# Patient Record
Sex: Male | Born: 1955 | Race: Black or African American | Hispanic: No | Smoking: Never smoker
Health system: Southern US, Community
[De-identification: ages and names within clinical notes are randomized; demographics above are authoritative.]

## PROBLEM LIST (undated history)

## (undated) DIAGNOSIS — B20 Human immunodeficiency virus [HIV] disease: Secondary | ICD-10-CM

## (undated) DIAGNOSIS — M1990 Unspecified osteoarthritis, unspecified site: Secondary | ICD-10-CM

---

## 1993-10-08 HISTORY — PX: OTHER SURGICAL HISTORY: SHX169

## 1999-05-22 DIAGNOSIS — F419 Anxiety disorder, unspecified: Secondary | ICD-10-CM

## 1999-05-22 HISTORY — DX: Anxiety disorder, unspecified: F41.9

## 2020-05-21 ENCOUNTER — Encounter (HOSPITAL_COMMUNITY): Payer: Self-pay

## 2020-05-21 ENCOUNTER — Emergency Department (HOSPITAL_COMMUNITY): Payer: No Typology Code available for payment source

## 2020-05-21 ENCOUNTER — Observation Stay (HOSPITAL_BASED_OUTPATIENT_CLINIC_OR_DEPARTMENT_OTHER): Payer: No Typology Code available for payment source

## 2020-05-21 ENCOUNTER — Other Ambulatory Visit: Payer: Self-pay

## 2020-05-21 ENCOUNTER — Observation Stay (HOSPITAL_COMMUNITY)
Admission: EM | Admit: 2020-05-21 | Discharge: 2020-05-23 | Disposition: A | Discharge status: Home / Self Care | Payer: No Typology Code available for payment source | Attending: Internal Medicine | Admitting: Internal Medicine

## 2020-05-21 DIAGNOSIS — G459 Transient cerebral ischemic attack, unspecified: Principal | ICD-10-CM

## 2020-05-21 DIAGNOSIS — I10 Essential (primary) hypertension: Secondary | ICD-10-CM | POA: Insufficient documentation

## 2020-05-21 DIAGNOSIS — Z7982 Long term (current) use of aspirin: Secondary | ICD-10-CM | POA: Diagnosis not present

## 2020-05-21 DIAGNOSIS — Z79899 Other long term (current) drug therapy: Secondary | ICD-10-CM | POA: Diagnosis not present

## 2020-05-21 DIAGNOSIS — Z20822 Contact with and (suspected) exposure to covid-19: Secondary | ICD-10-CM | POA: Insufficient documentation

## 2020-05-21 DIAGNOSIS — Z21 Asymptomatic human immunodeficiency virus [HIV] infection status: Secondary | ICD-10-CM | POA: Insufficient documentation

## 2020-05-21 DIAGNOSIS — R202 Paresthesia of skin: Secondary | ICD-10-CM | POA: Diagnosis present

## 2020-05-21 HISTORY — DX: Human immunodeficiency virus (HIV) disease: B20

## 2020-05-21 HISTORY — DX: Unspecified osteoarthritis, unspecified site: M19.90

## 2020-05-21 LAB — CBC
HCT: 39.4 % (ref 39.0–52.0)
Hemoglobin: 13 g/dL (ref 13.0–17.0)
MCH: 30.2 pg (ref 26.0–34.0)
MCHC: 33 g/dL (ref 30.0–36.0)
MCV: 91.6 fL (ref 80.0–100.0)
Platelets: 188 10*3/uL (ref 150–400)
RBC: 4.3 MIL/uL (ref 4.22–5.81)
RDW: 13.3 % (ref 11.5–15.5)
WBC: 3.8 10*3/uL — ABNORMAL LOW (ref 4.0–10.5)
nRBC: 0 % (ref 0.0–0.2)

## 2020-05-21 LAB — ECHOCARDIOGRAM COMPLETE BUBBLE STUDY
Area-P 1/2: 2.48 cm2
S' Lateral: 3.08 cm

## 2020-05-21 LAB — URINALYSIS, COMPLETE (UACMP) WITH MICROSCOPIC
Bacteria, UA: NONE SEEN
Bilirubin Urine: NEGATIVE
Glucose, UA: NEGATIVE mg/dL
Hgb urine dipstick: NEGATIVE
Ketones, ur: NEGATIVE mg/dL
Leukocytes,Ua: NEGATIVE
Nitrite: NEGATIVE
Protein, ur: NEGATIVE mg/dL
Specific Gravity, Urine: 1.003 — ABNORMAL LOW (ref 1.005–1.030)
pH: 7 (ref 5.0–8.0)

## 2020-05-21 LAB — BASIC METABOLIC PANEL
Anion gap: 8 (ref 5–15)
BUN: 11 mg/dL (ref 8–23)
CO2: 27 mmol/L (ref 22–32)
Calcium: 9.2 mg/dL (ref 8.9–10.3)
Chloride: 105 mmol/L (ref 98–111)
Creatinine, Ser: 1 mg/dL (ref 0.61–1.24)
GFR calc Af Amer: >60 mL/min (ref >60)
GFR calc non Af Amer: >60 mL/min (ref >60)
Glucose, Bld: 97 mg/dL (ref 70–99)
Potassium: 3.9 mmol/L (ref 3.5–5.1)
Sodium: 140 mmol/L (ref 135–145)

## 2020-05-21 LAB — HEPATIC FUNCTION PANEL
ALT: 17 U/L (ref 0–44)
AST: 24 U/L (ref 15–41)
Albumin: 4 g/dL (ref 3.5–5.0)
Alkaline Phosphatase: 45 U/L (ref 38–126)
Bilirubin, Direct: 0.1 mg/dL (ref 0.0–0.2)
Indirect Bilirubin: 0.7 mg/dL (ref 0.3–0.9)
Total Bilirubin: 0.8 mg/dL (ref 0.3–1.2)
Total Protein: 7.2 g/dL (ref 6.5–8.1)

## 2020-05-21 LAB — SARS CORONAVIRUS 2 BY RT PCR (HOSPITAL ORDER, PERFORMED IN ~~LOC~~ HOSPITAL LAB): SARS Coronavirus 2: NEGATIVE

## 2020-05-21 LAB — LIPID PANEL
Cholesterol: 99 mg/dL (ref 0–200)
HDL: 38 mg/dL — ABNORMAL LOW (ref >40)
LDL Cholesterol: 52 mg/dL (ref 0–99)
Total CHOL/HDL Ratio: 2.6 RATIO
Triglycerides: 43 mg/dL (ref <150)
VLDL: 9 mg/dL (ref 0–40)

## 2020-05-21 LAB — TROPONIN I (HIGH SENSITIVITY)
Troponin I (High Sensitivity): 5 ng/L (ref <18)
Troponin I (High Sensitivity): 5 ng/L (ref <18)

## 2020-05-21 LAB — VITAMIN B12: Vitamin B-12: 582 pg/mL (ref 180–914)

## 2020-05-21 LAB — HEMOGLOBIN A1C
Hgb A1c MFr Bld: 6.1 % — ABNORMAL HIGH (ref 4.8–5.6)
Mean Plasma Glucose: 128.37 mg/dL

## 2020-05-21 LAB — TSH: TSH: 0.824 u[IU]/mL (ref 0.350–4.500)

## 2020-05-21 LAB — FOLATE: Folate: 14.4 ng/mL (ref >5.9)

## 2020-05-21 MED ORDER — ATORVASTATIN CALCIUM 40 MG PO TABS
40.0000 mg | ORAL_TABLET | Freq: Every day | ORAL | Status: DC
  Administered 2020-05-21 – 2020-05-23 (×3): 40 mg via ORAL
  Filled 2020-05-21 (×3): qty 1

## 2020-05-21 MED ORDER — ENOXAPARIN SODIUM 40 MG/0.4ML ~~LOC~~ SOLN
40.0000 mg | SUBCUTANEOUS | Status: DC
  Administered 2020-05-22: 40 mg via SUBCUTANEOUS
  Filled 2020-05-21: qty 0.4

## 2020-05-21 MED ORDER — ASPIRIN EC 81 MG PO TBEC
81.0000 mg | DELAYED_RELEASE_TABLET | Freq: Every day | ORAL | Status: DC
  Administered 2020-05-21 – 2020-05-23 (×3): 81 mg via ORAL
  Filled 2020-05-21 (×3): qty 1

## 2020-05-21 NOTE — Progress Notes (Signed)
  Echocardiogram 2D Echocardiogram has been performed.  Delcie Roch 05/21/2020, 4:20 PM

## 2020-05-21 NOTE — ED Triage Notes (Signed)
Pt says woke up around 0630 with numbness to r side of face and both arms.  Reports numbness lasted approx then noticed a heavy sensation in left jaw and left shoulder blade.  Denies any chest pain.  Reports woke up at 0315 to use the bathroom and didn't have these symptoms.

## 2020-05-21 NOTE — Consult Note (Signed)
[facility] Tele[facility]urology Consult Services  Stat Consult  Date of Service:   05/21/2020 10:26:00  Impression:     .  G45.9 - Transient cerebral ischemic attack, unspecified  Comments/Sign-Out: Patient is 64yoM with hx of HIV and prior TIA who p/w transient facial and arm numbness. Exam is non-focal. CTH negative for acute abnormalities. Arm numbness may be due to spinal stenosis, vs radiculopathy. Patient had also transient facial numbness therefore recommend to complete TIA work up  CT HEAD: Showed No Acute Hemorrhage or Acute Core Infarct  Metrics: [facility] Notification Time: 05/21/2020 10:24:43 Stamp Time: 05/21/2020 10:26:00 Callback Response Time: 05/21/2020 10:28:22  Our recommendations are outlined below.  Recommendations:     Marland Kitchen  MRI brain     .  MRI c-spine     .  Carotid doppler     .  Echo of the heart     .  ASA 81mg      .  Lipitor 40mg  Qhs (pending LDL)     .  Labs: lipid panel, A1c   Therapies:     .  Physical Therapy, Occupational Therapy, Speech Therapy Assessment When Applicable  Other WorkUp:     .  Check B12 level     .  Check TSH  Disposition: Neurology Follow Up Recommended  Sign Out:     .  Discussed with Emergency Department Provider  ----------------------------------------------------------------------------------------------------  Chief Complaint: Transient facial and arm numbness  History of Present Illness: Patient is a 64 year old Male.  Patient is [phone] with hx of HIV and prior TIA who p/w transient facial and arm numbness. Patient woke up this morning around 6:30amd with right facial numbness and b/l arm numbness. Symptoms lasted about 71 and resolved. He denied associated weakness, no vision changes. Patient came to ED for evaluation On my evaluation he is awake and following commands. He reports numbness has resolved. He has left shoulder pain/soreness. He reports he worked out yesterday lifting more  weights than normal.   Anticoagulant use:  No  Antiplatelet use: No    Examination: BP(158/91), Pulse(50), Blood Glucose(97) 1A: Level of Consciousness - Alert; keenly responsive + 0 1B: Ask Month and Age - Both Questions Right + 0 1C: Blink Eyes & Squeeze Hands - Performs Both Tasks + 0 2: Test Horizontal Extraocular Movements - Normal + 0 3: Test Visual Fields - No Visual Loss + 0 4: Test Facial Palsy (Use Grimace if Obtunded) - Normal symmetry + 0 5A: Test Left Arm Motor Drift - No Drift for 10 Seconds + 0 5B: Test Right Arm Motor Drift - No Drift for 10 Seconds + 0 6A: Test Left Leg Motor Drift - No Drift for 5 Seconds + 0 6B: Test Right Leg Motor Drift - No Drift for 5 Seconds + 0 7: Test Limb Ataxia (FNF/Heel-Shin) - No Ataxia + 0 8: Test Sensation - Normal; No sensory loss + 0 9: Test Language/Aphasia - Normal; No aphasia + 0 10: Test Dysarthria - Normal + 0 11: Test Extinction/Inattention - No abnormality + 0  NIHSS Score: 0   Patient/Family was informed the Neurology Consult would occur via TeleHealth consult by way of interactive audio and video telecommunications and consented to receiving care in this manner.  Patient is being evaluated for possible acute neurologic impairment and high probability of imminent or life-threatening deterioration. I spent total of 35 minutes providing care to this patient, including time for face to face visit via telemedicine, review of medical records,  imaging studies and discussion of findings with providers, the patient and/or family.   Dr Kateri Plummer   [facility] [phone]  Case [id number]

## 2020-05-21 NOTE — H&P (Signed)
History and Physical  Jesus Ford UKG:[id number] DOB: 04/24/1956 DOA: 05/21/2020  Referring physician: ER provider [facility]:   Patient coming from: Home  Chief Complaint: Numbness of the hands and face  HPI: Patient is a 64 year old African-American male with past medical history significant for HIV for over 30 years, hypertension and prior TIA.  Patient woke up this morning with numbness of both hands.  According to the patient, numbness on the left upper extremity lasted for about 10 minutes and subsequently he developed numbness of the right upper extremity that lasted for 15 minutes.  Afterwards, he developed numbness of the face that lasted for about 5 minutes.  Patient decided to come to the hospital for further assessment and management, considering different that he has had prior TIA.  On presentation to the hospital, CT head done was nonrevealing, chest x-ray was nonrevealing, blood pressure ranged from 128-158/70-91 mmHg, heart rate of 46 to 51 bpm.  Basic lab work done revealed sodium of 140, potassium of 3.9, chloride 105, CO2 of 27, BUN of 11, serum creatinine of 1 with blood sugar of 97.  CBC revealed WBC of 3.8, hemoglobin of 13, hematocrit of 39.4 with platelet count of 188.  ER provider has discussed with telemetry neurology team and MRI brain and C-spine, carotid Doppler ultrasound, echocardiogram, lipid panel, B12 and TSH have been recommended.  Hospitalist team has been asked to admit patient for further assessment and management.  ED Course: As documented above.  Numbness has resolved. Pertinent labs: As documented above. EKG: Independently reviewed.  Imaging: independently reviewed.   Review of Systems:  No headache, no neck pain, no fever or chills, no URI symptoms, no shortness of breath, no GI symptoms or urinary symptoms.  Negative for visual changes, rash, new muscle aches or bleeding.    Past Medical History:  Diagnosis  Date  . HIV (human immunodeficiency virus infection) (HCC)   Hypertension. TIA.  Social history: Patient denied use of tobacco, alcohol or illicit drug.  Patient is single.  No Known Allergies  No family history on file.   Prior to Admission medications   Medication Sig Start Date End Date Taking? Authorizing Provider  aspirin EC 81 MG tablet Take 81 mg by mouth daily. Swallow whole.   Yes [provider]  atorvastatin (LIPITOR) 20 MG tablet Take 20 mg by mouth at bedtime.   Yes [provider]  bictegravir-emtricitabine-tenofovir AF (BIKTARVY) 50-200-25 MG TABS tablet Take 1 tablet by mouth daily.   Yes [provider]  Cholecalciferol (VITAMIN D3 PO) Take 1 tablet by mouth daily.   Yes [provider]  hydrOXYzine (ATARAX/VISTARIL) 25 MG tablet Take 25 mg by mouth 2 (two) times daily as needed.   Yes [provider]  lisinopril (ZESTRIL) 5 MG tablet Take 5 mg by mouth daily.   Yes [provider]    Physical Exam: Vitals:   05/21/20 1100 05/21/20 1125 05/21/20 1129 05/21/20 1130  BP: 128/70   (!) 145/79  Pulse: (!) 46 (!) 50 (!) 48 (!) 49  Resp: 18 16 16 18   Temp:      TempSrc:      SpO2: 100% 100% 100% 100%  Weight:      Height:        Constitutional:  . Appears calm and comfortable Eyes:  . No pallor. No jaundice.  ENMT:  . external ears, nose appear normal Neck:  . Neck is supple. No JVD Respiratory:  .  CTA bilaterally, no w/r/r.  . Respiratory effort normal. No retractions or accessory muscle use Cardiovascular:  . S1S2, soft systolic murmur with increased intensity of S2 component of the heart sound . No LE extremity edema   Abdomen:  . Abdomen is soft and non tender. Organs are difficult to assess. Neurologic:  . Awake and alert. . Moves all limbs. . No neurologic deficit elicited.  Wt Readings from Last 3 Encounters:  05/21/20 70.3 kg    I have personally reviewed following labs and imaging  studies  Labs on Admission:  CBC: Recent Labs  Lab 05/21/20 0813  WBC 3.8*  HGB 13.0  HCT 39.4  MCV 91.6  PLT 188   Basic Metabolic Panel: Recent Labs  Lab 05/21/20 0813  NA 140  K 3.9  CL 105  CO2 27  GLUCOSE 97  BUN 11  CREATININE 1.00  CALCIUM 9.2   Liver Function Tests: Recent Labs  Lab 05/21/20 0842  AST 24  ALT 17  ALKPHOS 45  BILITOT 0.8  PROT 7.2  ALBUMIN 4.0   No results for input(s): LIPASE, AMYLASE in the last 168 hours. No results for input(s): AMMONIA in the last 168 hours. Coagulation Profile: No results for input(s): INR, PROTIME in the last 168 hours. Cardiac Enzymes: No results for input(s): CKTOTAL, CKMB, CKMBINDEX, TROPONINI in the last 168 hours. BNP (last 3 results) No results for input(s): PROBNP in the last 8760 hours. HbA1C: No results for input(s): HGBA1C in the last 72 hours. CBG: No results for input(s): GLUCAP in the last 168 hours. Lipid Profile: No results for input(s): CHOL, HDL, LDLCALC, TRIG, CHOLHDL, LDLDIRECT in the last 72 hours. Thyroid Function Tests: No results for input(s): TSH, T4TOTAL, FREET4, T3FREE, THYROIDAB in the last 72 hours. Anemia Panel: No results for input(s): VITAMINB12, FOLATE, FERRITIN, TIBC, IRON, RETICCTPCT in the last 72 hours. Urine analysis: No results found for: COLORURINE, APPEARANCEUR, LABSPEC, PHURINE, GLUCOSEU, HGBUR, BILIRUBINUR, KETONESUR, PROTEINUR, UROBILINOGEN, NITRITE, LEUKOCYTESUR Sepsis Labs: @LABRCNTIP (procalcitonin:4,lacticidven:4) )No results found for this or any previous visit (from the past 240 hour(s)).    Radiological Exams on Admission: DG Chest 2 View  Result Date: 05/21/2020 CLINICAL DATA:  Left-sided chest and jaw pain, initial encounter EXAM: CHEST - 2 VIEW COMPARISON:  01/21/2020 FINDINGS: Cardiac shadow is stable. The lungs are well aerated bilaterally. No focal infiltrate or sizable effusion is seen. No bony abnormality is noted. IMPRESSION: No acute abnormality  noted Electronically Signed   By: 01/23/2020 M.D.   On: 05/21/2020 08:12   CT Head Wo Contrast  Result Date: 05/21/2020 CLINICAL DATA:  Numbness to the right-side of the face and both arms. EXAM: CT HEAD WITHOUT CONTRAST TECHNIQUE: Contiguous axial images were obtained from the base of the skull through the vertex without intravenous contrast. COMPARISON:  Report from CT head dated 07/06/2018. FINDINGS: Brain: No evidence of acute infarction, hemorrhage, hydrocephalus, extra-axial collection or mass lesion/mass effect. Vascular: No hyperdense vessel or unexpected calcification. Skull: Normal. Negative for fracture or focal lesion. Sinuses/Orbits: No acute finding. Other: None. IMPRESSION: No acute intracranial process. Electronically Signed   By: 07/08/2018 M.D.   On: 05/21/2020 09:27    EKG: Independently reviewed.   Active Problems:   TIA (transient ischemic attack)   Assessment/Plan Possible TIA: -Proceed with MRI brain as documented above. -Carotid Doppler ultrasound. -Echocardiogram -Lipid profile. -Aspirin. -UA and HbA1c. -Permissive hypertension. -Neurology input is highly appreciated. -Further management depend on above.  Possible cervical stenosis: -MRI C-spine. -B12, folate  and TSH. -Further management will depend on above.  Hypertension: -Permissive hypertension.  HIV: -Patient has had HIV for over 30 years. -Patient tells me that viral load is undetectable. -Check CD4 count and viral load. -Continue current home HIV medication.  DVT prophylaxis: Subcutaneous Lovenox Code Status: Full code Family Communication:  Disposition Plan: Likely discharge back home after work-up. Consults called: ER provider has already consulted telemetry neurology. Admission status: Observation.  Time spent: 65 minutes  Berton Mount, MD  [facility] Pager #: [phone] 7PM-7AM contact night coverage as above  05/21/2020, 1:26 PM

## 2020-05-21 NOTE — ED Provider Notes (Signed)
[facility] EMERGENCY DEPARTMENT Provider Note   CSN: [id number] Arrival date & time: 05/21/20  0720     History Chief Complaint  Patient presents with  . Jaw Pain    Jesus Ford is a 64 y.o. male.  Patient has a history of a TIA.  Patient also is HIV positive.  Patient awoke today and had some numbness to both arms and then had numbness to the right side of his face which has improved now.  The history is provided by the patient and medical records. No language interpreter was used.  Weakness Severity:  Mild Onset quality:  Sudden Timing:  Unable to specify Progression:  Resolved Chronicity:  New Context: not alcohol use   Worsened by:  Nothing Associated symptoms: no abdominal pain, no chest pain, no cough, no diarrhea, no frequency, no headaches and no seizures        Past Medical History:  Diagnosis Date  . HIV (human immunodeficiency virus infection) (HCC)     There are no problems to display for this patient.      No family history on file.  Social History   Tobacco Use  . Smoking status: Not on file  Substance Use Topics  . Alcohol use: Not on file  . Drug use: Not on file    Home Medications Prior to Admission medications   Medication Sig Start Date End Date Taking? Authorizing Provider  aspirin EC 81 MG tablet Take 81 mg by mouth daily. Swallow whole.   Yes [provider]  bictegravir-emtricitabine-tenofovir AF (BIKTARVY) 50-200-25 MG TABS tablet Take 1 tablet by mouth daily.   Yes [provider]  Cholecalciferol (VITAMIN D3 PO) Take 1 tablet by mouth daily.   Yes [provider]    Allergies    Patient has no known allergies.  Review of Systems   Review of Systems  Constitutional: Negative for appetite change and fatigue.  HENT: Negative for congestion, ear discharge and sinus pressure.   Eyes: Negative for discharge.  Respiratory: Negative for cough.   Cardiovascular: Negative for chest pain.   Gastrointestinal: Negative for abdominal pain and diarrhea.  Genitourinary: Negative for frequency and hematuria.  Musculoskeletal: Negative for back pain.  Skin: Negative for rash.  Neurological: Positive for weakness. Negative for seizures and headaches.       Numbness and is face and arms  Psychiatric/Behavioral: Negative for hallucinations.    Physical Exam Updated Vital Signs BP (!) 145/79   Pulse (!) 49   Temp 98 F (36.7 C) (Oral)   Resp 18   Ht 5\' 6"  (1.676 m)   Wt 70.3 kg   SpO2 100%   BMI 25.02 kg/m   Physical Exam Vitals and nursing note reviewed.  Constitutional:      Appearance: He is well-developed.  HENT:     Head: Normocephalic.     Nose: Nose normal.  Eyes:     General: No scleral icterus.    Conjunctiva/sclera: Conjunctivae normal.  Neck:     Thyroid: No thyromegaly.  Cardiovascular:     Rate and Rhythm: Normal rate and regular rhythm.     Heart sounds: No murmur heard.  No friction rub. No gallop.   Pulmonary:     Breath sounds: No stridor. No wheezing or rales.  Chest:     Chest wall: No tenderness.  Abdominal:     General: There is no distension.     Tenderness: There is no abdominal tenderness. There is no rebound.  Musculoskeletal:        General: Normal range of motion.     Cervical back: Neck supple.  Lymphadenopathy:     Cervical: No cervical adenopathy.  Skin:    Findings: No erythema or rash.  Neurological:     Mental Status: He is alert and oriented to person, place, and time.     Motor: No abnormal muscle tone.     Coordination: Coordination normal.  Psychiatric:        Behavior: Behavior normal.     ED Results / Procedures / Treatments   Labs (all labs ordered are listed, but only abnormal results are displayed) Labs Reviewed  CBC - Abnormal; Notable for the following components:      Result Value   WBC 3.8 (*)    All other components within normal limits  BASIC METABOLIC PANEL  HEPATIC FUNCTION PANEL  TROPONIN I  (HIGH SENSITIVITY)  TROPONIN I (HIGH SENSITIVITY)    EKG None  Radiology DG Chest 2 View  Result Date: 05/21/2020 CLINICAL DATA:  Left-sided chest and jaw pain, initial encounter EXAM: CHEST - 2 VIEW COMPARISON:  01/21/2020 FINDINGS: Cardiac shadow is stable. The lungs are well aerated bilaterally. No focal infiltrate or sizable effusion is seen. No bony abnormality is noted. IMPRESSION: No acute abnormality noted Electronically Signed   By: Alcide Clever M.D.   On: 05/21/2020 08:12   CT Head Wo Contrast  Result Date: 05/21/2020 CLINICAL DATA:  Numbness to the right-side of the face and both arms. EXAM: CT HEAD WITHOUT CONTRAST TECHNIQUE: Contiguous axial images were obtained from the base of the skull through the vertex without intravenous contrast. COMPARISON:  Report from CT head dated 07/06/2018. FINDINGS: Brain: No evidence of acute infarction, hemorrhage, hydrocephalus, extra-axial collection or mass lesion/mass effect. Vascular: No hyperdense vessel or unexpected calcification. Skull: Normal. Negative for fracture or focal lesion. Sinuses/Orbits: No acute finding. Other: None. IMPRESSION: No acute intracranial process. Electronically Signed   By: Romona Curls M.D.   On: 05/21/2020 09:27    Procedures Procedures (including critical care time)  Medications Ordered in ED Medications - No data to display  ED Course  I have reviewed the triage vital signs and the nursing notes.  Pertinent labs & imaging results that were available during my care of the patient were reviewed by me and considered in my medical decision making (see chart for details).    MDM Rules/Calculators/A&P                          Patient was seen by neurology for TIA work-up.  They recommend admission TIA work-up aspirin possibly Lipitor and getting MRI of the brain and an MRI neck to rule out any type of cervical radiculopathy.        This patient presents to the ED for concern of numbness in her arms  and face, this involves an extensive number of treatment options, and is a complaint that carries with it a high risk of complications and morbidity.  The differential diagnosis includes radiculopathy TIA  Lab Tests:   I Ordered, reviewed, and interpreted labs, which included CBC and chemistries which show mild anemia  Medicines ordered:   No medicines ordered  Imaging Studies ordered:   I ordered imaging studies which included CT of the head  I independently visualized and interpreted imaging which showed no acute stroke  Additional history obtained:   Additional history obtained from records  Previous records  obtained and reviewed.  Consultations Obtained:   I consulted neurology and hospitalist and discussed lab and imaging findings  Reevaluation:  After the interventions stated above, I reevaluated the patient and found improved  Critical Interventions:  .   Final Clinical Impression(s) / ED Diagnoses Final diagnoses:  TIA (transient ischemic attack)    Rx / DC Orders ED Discharge Orders    None       Bethann Berkshire, MD 05/22/20 1258

## 2020-05-21 NOTE — ED Notes (Signed)
Neuro consult at this time

## 2020-05-21 NOTE — ED Notes (Signed)
Pt verbalized he was still sore some from working out yesterday.

## 2020-05-22 ENCOUNTER — Encounter (HOSPITAL_COMMUNITY): Payer: Self-pay | Admitting: Internal Medicine

## 2020-05-22 ENCOUNTER — Observation Stay (HOSPITAL_COMMUNITY): Payer: No Typology Code available for payment source

## 2020-05-22 DIAGNOSIS — G459 Transient cerebral ischemic attack, unspecified: Secondary | ICD-10-CM | POA: Diagnosis not present

## 2020-05-22 LAB — MAGNESIUM: Magnesium: 2.2 mg/dL (ref 1.7–2.4)

## 2020-05-22 LAB — BASIC METABOLIC PANEL
Anion gap: 7 (ref 5–15)
BUN: 12 mg/dL (ref 8–23)
CO2: 26 mmol/L (ref 22–32)
Calcium: 8.9 mg/dL (ref 8.9–10.3)
Chloride: 104 mmol/L (ref 98–111)
Creatinine, Ser: 0.92 mg/dL (ref 0.61–1.24)
GFR calc Af Amer: >60 mL/min (ref >60)
GFR calc non Af Amer: >60 mL/min (ref >60)
Glucose, Bld: 91 mg/dL (ref 70–99)
Potassium: 3.6 mmol/L (ref 3.5–5.1)
Sodium: 137 mmol/L (ref 135–145)

## 2020-05-22 LAB — CBC
HCT: 37.5 % — ABNORMAL LOW (ref 39.0–52.0)
Hemoglobin: 12.7 g/dL — ABNORMAL LOW (ref 13.0–17.0)
MCH: 30.5 pg (ref 26.0–34.0)
MCHC: 33.9 g/dL (ref 30.0–36.0)
MCV: 90.1 fL (ref 80.0–100.0)
Platelets: 193 10*3/uL (ref 150–400)
RBC: 4.16 MIL/uL — ABNORMAL LOW (ref 4.22–5.81)
RDW: 13.2 % (ref 11.5–15.5)
WBC: 4.5 10*3/uL (ref 4.0–10.5)
nRBC: 0 % (ref 0.0–0.2)

## 2020-05-22 LAB — PHOSPHORUS: Phosphorus: 3.6 mg/dL (ref 2.5–4.6)

## 2020-05-22 MED ORDER — VITAMIN D 25 MCG (1000 UNIT) PO TABS
1000.0000 [IU] | ORAL_TABLET | Freq: Every day | ORAL | Status: DC
  Administered 2020-05-22 – 2020-05-23 (×2): 1000 [IU] via ORAL
  Filled 2020-05-22 (×4): qty 1

## 2020-05-22 MED ORDER — BICTEGRAVIR-EMTRICITAB-TENOFOV 50-200-25 MG PO TABS
1.0000 | ORAL_TABLET | Freq: Every day | ORAL | Status: DC
  Administered 2020-05-22 – 2020-05-23 (×2): 1 via ORAL
  Filled 2020-05-22 (×5): qty 1

## 2020-05-22 NOTE — ED Notes (Signed)
Pt given ice cream and cranberry juice per request.

## 2020-05-22 NOTE — Progress Notes (Signed)
PROGRESS NOTE    Cheron SchaumannJimmy Kovacic  ZOX:[id number] DOB: 01/19/1956 DOA: 05/21/2020 [facility]  Out[facility] Specialists:   Brief Narrative:  As per H&P done earlier "Patient is a 64 year old African-American male with past medical history significant for HIV for over 30 years, hypertension and prior TIA.  Patient woke up this morning with numbness of both hands.  According to the patient, numbness on the left upper extremity lasted for about 10 minutes and subsequently he developed numbness of the right upper extremity that lasted for 15 minutes.  Afterwards, he developed numbness of the face that lasted for about 5 minutes.  Patient decided to come to the hospital for further assessment and management, considering different that he has had prior TIA.  On presentation to the hospital, CT head done was nonrevealing, chest x-ray was nonrevealing, blood pressure ranged from 128-158/70-91 mmHg, heart rate of 46 to 51 bpm.  Basic lab work done revealed sodium of 140, potassium of 3.9, chloride 105, CO2 of 27, BUN of 11, serum creatinine of 1 with blood sugar of 97.  CBC revealed WBC of 3.8, hemoglobin of 13, hematocrit of 39.4 with platelet count of 188.  ER provider has discussed with telemetry neurology team and MRI brain and C-spine, carotid Doppler ultrasound, echocardiogram, lipid panel, B12 and TSH have been recommended.  Hospitalist team has been asked to admit patient for further assessment and management".  05/22/2020: Patient seen.  Patient has remained stable.  No numbness reported.  Patient is awaiting MRI of the brain.  Likely discharge home tomorrow if MRI brain is nonrevealing.  Echocardiogram revealed: 1. Left ventricular ejection fraction, by estimation, is 60 to 65%. The  left ventricle has normal function. The left ventricle has no regional  wall motion abnormalities. Left ventricular diastolic parameters were  normal.  2. Right ventricular systolic function is normal. The  right ventricular  size is normal. Tricuspid regurgitation signal is inadequate for assessing  PA pressure.  3. The mitral valve is grossly normal. No evidence of mitral valve  regurgitation. No evidence of mitral stenosis.  4. The aortic valve is tricuspid. Aortic valve regurgitation is not  visualized. No aortic stenosis is present.  5. The inferior vena cava is normal in size with greater than 50%  respiratory variability, suggesting right atrial pressure of 3 mmHg.   Conclusion(s)/Recommendation(s): No intracardiac source of embolism  detected on this transthoracic study. A transesophageal echocardiogram is  recommended to exclude cardiac source of embolism if clinically indicated.  Assessment & Plan:   Active Problems:   TIA (transient ischemic attack)  Possible TIA: -Patient is awaiting MRI brain.   -Carotid Doppler ultrasound -no critical stenosis.. -Echocardiogram-report is as documented above. -Lipid profile LDL is 52. -Aspirin. -UA is nonrevealing. -HbA1c 6.1%.   -Continue permissive hypertension. -Neurology input is highly appreciated. -Further management depend on above.  Possible cervical stenosis: -Awaiting MRI C-spine. -B12 82. -TSH 0.824.  -Folate is 14.4.   -Further management will depend on above.  Hypertension: -Permissive hypertension.  HIV: -Patient has had HIV for over 30 years. -Patient tells me that viral load is undetectable. -Follow CD4 count and viral load. -Continue current home HIV medication.  DVT prophylaxis: Subcutaneous Lovenox Code Status: Full code Family Communication:  Disposition Plan: Likely discharge home if MRI brain and MRI C-spine are both nonrevealing.   Consultants:   Telemetry neurology  Procedures:   Echocardiogram  Antimicrobials:   None   Subjective: No numbness No motor weakness No speech problems  Objective: Vitals:   05/22/20 1100 05/22/20 1300 05/22/20 1500 05/22/20 1700  BP: 111/76  116/66 (!) 97/58 108/70  Pulse: (!) 51 (!) 55 (!) 52 (!) 54  Resp: 18 18 18    Temp: 97.8 F (36.6 C) 98.3 F (36.8 C) 98 F (36.7 C) 98.3 F (36.8 C)  TempSrc: Oral Oral Oral Oral  SpO2: 100% 100% 100% 100%  Weight:      Height:        Intake/Output Summary (Last 24 hours) at 05/22/2020 1812 Last data filed at 05/22/2020 1200 Gross per 24 hour  Intake 480 ml  Output --  Net 480 ml   Filed Weights   05/21/20 0727  Weight: 70.3 kg    Examination:  General exam: Appears calm and comfortable  Respiratory system: Clear to auscultation.  Cardiovascular system: S1 & S2, soft systolic murmur with increased S2 component of second heart sound.   Gastrointestinal system: Abdomen is nondistended, soft and nontender. No organomegaly or masses felt. Normal bowel sounds heard. Central nervous system: Alert and oriented. No focal neurological deficits. Extremities: No leg edema.  Data Reviewed: I have personally reviewed following labs and imaging studies  CBC: Recent Labs  Lab 05/21/20 0813 05/22/20 0612  WBC 3.8* 4.5  HGB 13.0 12.7*  HCT 39.4 37.5*  MCV 91.6 90.1  PLT 188 193   Basic Metabolic Panel: Recent Labs  Lab 05/21/20 0813 05/22/20 0612  NA 140 137  K 3.9 3.6  CL 105 104  CO2 27 26  GLUCOSE 97 91  BUN 11 12  CREATININE 1.00 0.92  CALCIUM 9.2 8.9  MG  --  2.2  PHOS  --  3.6   GFR: Estimated Creatinine Clearance: 71.3 mL/min (by C-G formula based on SCr of 0.92 mg/dL). Liver Function Tests: Recent Labs  Lab 05/21/20 0842  AST 24  ALT 17  ALKPHOS 45  BILITOT 0.8  PROT 7.2  ALBUMIN 4.0   No results for input(s): LIPASE, AMYLASE in the last 168 hours. No results for input(s): AMMONIA in the last 168 hours. Coagulation Profile: No results for input(s): INR, PROTIME in the last 168 hours. Cardiac Enzymes: No results for input(s): CKTOTAL, CKMB, CKMBINDEX, TROPONINI in the last 168 hours. BNP (last 3 results) No results for input(s): PROBNP in the  last 8760 hours. HbA1C: Recent Labs    05/21/20 1332  HGBA1C 6.1*   CBG: No results for input(s): GLUCAP in the last 168 hours. Lipid Profile: Recent Labs    05/21/20 1332  CHOL 99  HDL 38*  LDLCALC 52  TRIG 43  CHOLHDL 2.6   Thyroid Function Tests: Recent Labs    05/21/20 1332  TSH 0.824   Anemia Panel: Recent Labs    05/21/20 1332  VITAMINB12 582  FOLATE 14.4   Urine analysis:    Component Value Date/Time   COLORURINE STRAW (A) 05/21/2020 1322   APPEARANCEUR CLEAR 05/21/2020 1322   LABSPEC 1.003 (L) 05/21/2020 1322   PHURINE 7.0 05/21/2020 1322   GLUCOSEU NEGATIVE 05/21/2020 1322   HGBUR NEGATIVE 05/21/2020 1322   BILIRUBINUR NEGATIVE 05/21/2020 1322   KETONESUR NEGATIVE 05/21/2020 1322   PROTEINUR NEGATIVE 05/21/2020 1322   NITRITE NEGATIVE 05/21/2020 1322   LEUKOCYTESUR NEGATIVE 05/21/2020 1322   Sepsis Labs: @LABRCNTIP (procalcitonin:4,lacticidven:4)  ) Recent Results (from the past 240 hour(s))  SARS Coronavirus 2 by RT PCR (hospital order, performed in [facility] hospital lab) Nasopharyngeal Nasopharyngeal Swab     Status: None   Collection Time: 05/21/20  4:44 PM   Specimen: Nasopharyngeal Swab  Result Value Ref Range Status   SARS Coronavirus 2 NEGATIVE NEGATIVE Final    Comment: (NOTE) SARS-CoV-2 target nucleic acids are NOT DETECTED.  The SARS-CoV-2 RNA is generally detectable in upper and lower respiratory specimens during the acute phase of infection. The lowest concentration of SARS-CoV-2 viral copies this assay can detect is 250 copies / mL. A negative result does not preclude SARS-CoV-2 infection and should not be used as the sole basis for treatment or other patient management decisions.  A negative result may occur with improper specimen collection / handling, submission of specimen other than nasopharyngeal swab, presence of viral mutation(s) within the areas targeted by this assay, and inadequate number of viral copies (<250  copies / mL). A negative result must be combined with clinical observations, patient history, and epidemiological information.  Fact Sheet for Patients:   BoilerBrush.com.cy  Fact Sheet for Healthcare Providers: https://pope.com/  This test is not yet approved or  cleared by the [place] FDA and has been authorized for detection and/or diagnosis of SARS-CoV-2 by FDA under an Emergency Use Authorization (EUA).  This EUA will remain in effect (meaning this test can be used) for the duration of the COVID-19 declaration under Section 564(b)(1) of the Act, 21 U.S.C. section 360bbb-3(b)(1), unless the authorization is terminated or revoked sooner.  Performed at [facility], [address], [place], [place] [address]          Radiology Studies: DG Chest 2 View  Result Date: 05/21/2020 CLINICAL DATA:  Left-sided chest and jaw pain, initial encounter EXAM: CHEST - 2 VIEW COMPARISON:  01/21/2020 FINDINGS: Cardiac shadow is stable. The lungs are well aerated bilaterally. No focal infiltrate or sizable effusion is seen. No bony abnormality is noted. IMPRESSION: No acute abnormality noted Electronically Signed   By: Alcide Clever M.D.   On: 05/21/2020 08:12   CT Head Wo Contrast  Result Date: 05/21/2020 CLINICAL DATA:  Numbness to the right-side of the face and both arms. EXAM: CT HEAD WITHOUT CONTRAST TECHNIQUE: Contiguous axial images were obtained from the base of the skull through the vertex without intravenous contrast. COMPARISON:  Report from CT head dated 07/06/2018. FINDINGS: Brain: No evidence of acute infarction, hemorrhage, hydrocephalus, extra-axial collection or mass lesion/mass effect. Vascular: No hyperdense vessel or unexpected calcification. Skull: Normal. Negative for fracture or focal lesion. Sinuses/Orbits: No acute finding. Other: None. IMPRESSION: No acute intracranial process. Electronically Signed   By: Romona Curls M.D.    On: 05/21/2020 09:27   US Carotid Bilateral  Result Date: 05/22/2020 CLINICAL DATA:  TIA with right-sided facial numbness. EXAM: BILATERAL CAROTID DUPLEX ULTRASOUND TECHNIQUE: Wallace Cullens scale imaging, color Doppler and duplex ultrasound were performed of bilateral carotid and vertebral arteries in the neck. COMPARISON:  None. FINDINGS: Criteria: Quantification of carotid stenosis is based on velocity parameters that correlate the residual internal carotid diameter with NASCET-based stenosis levels, using the diameter of the distal internal carotid lumen as the denominator for stenosis measurement. The following velocity measurements were obtained: RIGHT ICA:  71/21 cm/sec CCA:  69/11 cm/sec SYSTOLIC ICA/CCA RATIO:  1.0 ECA:  70 cm/sec LEFT ICA:  68/21 cm/sec CCA:  63/13 cm/sec SYSTOLIC ICA/CCA RATIO:  1.1 ECA:  68 cm/sec RIGHT CAROTID ARTERY: The common carotid artery and carotid bulb demonstrate intimal thickening. There is no evidence of right ICA plaque or stenosis. RIGHT VERTEBRAL ARTERY: Antegrade flow with normal waveform and velocity. LEFT CAROTID ARTERY: The common carotid artery and  carotid bulb demonstrate intimal thickening. There may be minimal plaque at the left ICA origin. No evidence of significant left ICA stenosis. Estimated left ICA stenosis is less than 50%. LEFT VERTEBRAL ARTERY: Antegrade flow with normal waveform and velocity. IMPRESSION: Potential minimal plaque at the left ICA origin with estimated left ICA stenosis of less than 50%. No evidence of right ICA plaque or stenosis. Electronically Signed   By: Irish Lack M.D.   On: 05/22/2020 12:18   ECHOCARDIOGRAM COMPLETE BUBBLE STUDY  Result Date: 05/21/2020    ECHOCARDIOGRAM REPORT   Patient Name:   WARDEN BUFFA Date of Exam: 05/21/2020 Medical Rec #:  [id number]    Height:       66.0 in Accession #:    [id number]   Weight:       155.0 lb Date of Birth:  01/29/1956     BSA:          1.794 m Patient Age:    64 years     BP:            145/79 mmHg Patient Gender: M            HR:           49 bpm. Exam Location:  Inpatient Procedure: 2D Echo Indications:    stroke 434.91  History:        Patient has no prior history of Echocardiogram examinations. TIA                 and HIV.  Sonographer:    Delcie Roch RDCS Referring Phys: [id number] Maleik Vanderzee I Laquon Emel IMPRESSIONS  1. Left ventricular ejection fraction, by estimation, is 60 to 65%. The left ventricle has normal function. The left ventricle has no regional wall motion abnormalities. Left ventricular diastolic parameters were normal.  2. Right ventricular systolic function is normal. The right ventricular size is normal. Tricuspid regurgitation signal is inadequate for assessing PA pressure.  3. The mitral valve is grossly normal. No evidence of mitral valve regurgitation. No evidence of mitral stenosis.  4. The aortic valve is tricuspid. Aortic valve regurgitation is not visualized. No aortic stenosis is present.  5. The inferior vena cava is normal in size with greater than 50% respiratory variability, suggesting right atrial pressure of 3 mmHg. Conclusion(s)/Recommendation(s): No intracardiac source of embolism detected on this transthoracic study. A transesophageal echocardiogram is recommended to exclude cardiac source of embolism if clinically indicated. FINDINGS  Left Ventricle: Left ventricular ejection fraction, by estimation, is 60 to 65%. The left ventricle has normal function. The left ventricle has no regional wall motion abnormalities. The left ventricular internal cavity size was normal in size. There is  no left ventricular hypertrophy. Left ventricular diastolic parameters were normal. Right Ventricle: The right ventricular size is normal. No increase in right ventricular wall thickness. Right ventricular systolic function is normal. Tricuspid regurgitation signal is inadequate for assessing PA pressure. Left Atrium: Left atrial size was normal in size. Right Atrium: Right atrial  size was normal in size. Pericardium: Trivial pericardial effusion is present. Mitral Valve: The mitral valve is grossly normal. No evidence of mitral valve regurgitation. No evidence of mitral valve stenosis. Tricuspid Valve: The tricuspid valve is grossly normal. Tricuspid valve regurgitation is not demonstrated. No evidence of tricuspid stenosis. Aortic Valve: The aortic valve is tricuspid. Aortic valve regurgitation is not visualized. No aortic stenosis is present. Pulmonic Valve: The pulmonic valve was grossly normal. Pulmonic valve regurgitation is not visualized. No evidence of  pulmonic stenosis. Aorta: The aortic root and ascending aorta are structurally normal, with no evidence of dilitation. Venous: The inferior vena cava is normal in size with greater than 50% respiratory variability, suggesting right atrial pressure of 3 mmHg. IAS/Shunts: The atrial septum is grossly normal. Agitated saline contrast was given intravenously to evaluate for intracardiac shunting.  LEFT VENTRICLE PLAX 2D LVIDd:         4.19 cm  Diastology LVIDs:         3.08 cm  LV e' lateral:   13.10 cm/s LV PW:         1.07 cm  LV E/e' lateral: 4.9 LV IVS:        1.06 cm  LV e' medial:    10.10 cm/s LVOT diam:     2.30 cm  LV E/e' medial:  6.4 LV SV:         103 LV SV Index:   57 LVOT Area:     4.15 cm  RIGHT VENTRICLE RV S prime:     13.60 cm/s TAPSE (M-mode): 2.0 cm LEFT ATRIUM             Index LA diam:        3.10 cm 1.73 cm/m LA Vol (A2C):   37.1 ml 20.67 ml/m LA Vol (A4C):   38.3 ml 21.34 ml/m LA Biplane Vol: 38.5 ml 21.45 ml/m  AORTIC VALVE LVOT Vmax:   102.00 cm/s LVOT Vmean:  65.900 cm/s LVOT VTI:    0.248 m  AORTA Ao Root diam: 3.20 cm Ao Asc diam:  3.30 cm MITRAL VALVE MV Area (PHT): 2.48 cm    SHUNTS MV Decel Time: 306 msec    Systemic VTI:  0.25 m MV E velocity: 64.30 cm/s  Systemic Diam: 2.30 cm MV A velocity: 58.70 cm/s MV E/A ratio:  1.10 Lennie Odor MD Electronically signed by Lennie Odor MD Signature Date/Time:  05/21/2020/5:17:32 PM    Final         Scheduled Meds: . aspirin EC  81 mg Oral Daily  . atorvastatin  40 mg Oral Daily  . bictegravir-emtricitabine-tenofovir AF  1 tablet Oral Daily  . cholecalciferol  1,000 Units Oral Daily  . enoxaparin (LOVENOX) injection  40 mg Subcutaneous Q24H   Continuous Infusions:   LOS: 0 days    Time spent: 35 minutes    Berton Mount, MD  [facility] Pager #: [phone] 7PM-7AM contact night coverage as above

## 2020-05-23 ENCOUNTER — Observation Stay (HOSPITAL_COMMUNITY): Payer: No Typology Code available for payment source

## 2020-05-23 DIAGNOSIS — Z21 Asymptomatic human immunodeficiency virus [HIV] infection status: Secondary | ICD-10-CM

## 2020-05-23 DIAGNOSIS — G459 Transient cerebral ischemic attack, unspecified: Secondary | ICD-10-CM | POA: Diagnosis not present

## 2020-05-23 LAB — HIV-1 RNA QUANT-NO REFLEX-BLD
HIV 1 RNA Quant: 70 copies/mL
LOG10 HIV-1 RNA: 1.845 log10copy/mL

## 2020-05-23 MED ORDER — POTASSIUM CHLORIDE CRYS ER 20 MEQ PO TBCR
40.0000 meq | EXTENDED_RELEASE_TABLET | Freq: Once | ORAL | Status: AC
  Administered 2020-05-23: 40 meq via ORAL
  Filled 2020-05-23: qty 2

## 2020-05-23 NOTE — Clinical Social Work Note (Signed)
CSW completed [facility] emergency notification. Notification ID # is: [id number].  Memory Argue, LCSW Transitions of Care Clinical Social Worker [facility] Ph: [phone]

## 2020-05-23 NOTE — Discharge Summary (Signed)
Physician Discharge Summary  Jesus SchaumannJimmy Ford WJX:[id number] DOB: 05/17/1956 DOA: 05/21/2020  [facility]P: [facility]  Admit date: 05/21/2020 Discharge date: 05/23/2020  Admitted From: Home Disposition: Home  Recommendations for Outpatient Follow-up:  1. Follow up with PCP in 1-2 weeks 2. Please obtain BMP/CBC in one week your next doctors visit.    Discharge Condition: Stable CODE STATUS: Full code Diet recommendation: 2 g salt diet  Brief/Interim Summary:  64 year old African-American male with past medical history significant for HIV for over 30 years, hypertension and prior TIA. Patient woke up this morning with numbness of both hands. According to the patient, numbness on the left upper extremity lasted for about 10 minutes and subsequently he developed numbness of the right upper extremity that lasted for 15 minutes. Afterwards, he developed numbness of the face thatlasted for about 5 minutes. Patient decided to come to the hospital for further assessment and management, considering different that he has had prior TIA. On presentation to the hospital, CT head done was nonrevealing, chest x-ray was nonrevealing.  Echocardiogram showed ejection fraction 60 to 65%, MRI brain and cervical spine was also negative.  Patient felt great next day, no complaints.  Routine work-up in the hospital was negative.  He was ambulating without any issues and wanted to be discharged home.  Body mass index is 25.02 kg/m.         Discharge Diagnoses:  Active Problems:   TIA (transient ischemic attack)   Bilateral upper extremity numbness and weakness right greater than left -Suspect TIA versus peripheral neuropathy.  MRI brain/cervical spine is unremarkable.  Echocardiogram shows EF of 60 to 65%.  LDL 52, A1c 6.1.  UA is unremarkable.  Patient is ambulating without any issues, no neuro deficits appreciated this morning. -Follow-up outpatient with PCP  Essential hypertension -Resume home  meds  History of HIV -Resume other medications.   Subjective: Patient feels great, ambulating in his room without any issues wishes to go home.  Discharge Exam: Vitals:   05/22/20 2143 05/23/20 0507  BP: 110/75 123/79  Pulse: (!) 56 (!) 55  Resp: 16 17  Temp: 98.1 F (36.7 C) 98.3 F (36.8 C)  SpO2: 100% 100%   Vitals:   05/22/20 1700 05/22/20 1843 05/22/20 2143 05/23/20 0507  BP: 108/70 112/74 110/75 123/79  Pulse: (!) 54 (!) 59 (!) 56 (!) 55  Resp: 18 18 16 17   Temp: 98.3 F (36.8 C) 97.8 F (36.6 C) 98.1 F (36.7 C) 98.3 F (36.8 C)  TempSrc: Oral Oral    SpO2: 100% 100% 100% 100%  Weight:      Height:        General: Pt is alert, awake, not in acute distress Cardiovascular: RRR, S1/S2 +, no rubs, no gallops Respiratory: CTA bilaterally, no wheezing, no rhonchi Abdominal: Soft, NT, ND, bowel sounds + Extremities: no edema, no cyanosis  Discharge Instructions  Discharge Instructions    Discharge patient   Complete by: As directed    Discharge disposition: 01-Home or Self Care   Discharge patient date: 05/23/2020     Allergies as of 05/23/2020   No Known Allergies     Medication List    TAKE these medications   aspirin EC 81 MG tablet Take 81 mg by mouth daily. Swallow whole.   atorvastatin 20 MG tablet Commonly known as: LIPITOR Take 20 mg by mouth at bedtime.   Biktarvy 50-200-25 MG Tabs tablet Generic drug: bictegravir-emtricitabine-tenofovir AF Take 1 tablet by mouth daily.   hydrOXYzine 25  MG tablet Commonly known as: ATARAX/VISTARIL Take 25 mg by mouth 2 (two) times daily as needed.   lisinopril 5 MG tablet Commonly known as: ZESTRIL Take 5 mg by mouth daily.   VITAMIN D3 PO Take 1 tablet by mouth daily.       Follow-up Information    [facility] Schedule an appointment as soon as possible for a visit in 1 week(s).   Contact information: [address] [place] [address] [phone]              No  Known Allergies  You were cared for by a hospitalist during your hospital stay. If you have any questions about your discharge medications or the care you received while you were in the hospital after you are discharged, you can call the unit and asked to speak with the hospitalist on call if the hospitalist that took care of you is not available. Once you are discharged, your primary care physician will handle any further medical issues. Please note that no refills for any discharge medications will be authorized once you are discharged, as it is imperative that you return to your primary care physician (or establish a relationship with a primary care physician if you do not have one) for your aftercare needs so that they can reassess your need for medications and monitor your lab values.   Procedures/Studies: DG Chest 2 View  Result Date: 05/21/2020 CLINICAL DATA:  Left-sided chest and jaw pain, initial encounter EXAM: CHEST - 2 VIEW COMPARISON:  01/21/2020 FINDINGS: Cardiac shadow is stable. The lungs are well aerated bilaterally. No focal infiltrate or sizable effusion is seen. No bony abnormality is noted. IMPRESSION: No acute abnormality noted Electronically Signed   By: Alcide Clever M.D.   On: 05/21/2020 08:12   CT Head Wo Contrast  Result Date: 05/21/2020 CLINICAL DATA:  Numbness to the right-side of the face and both arms. EXAM: CT HEAD WITHOUT CONTRAST TECHNIQUE: Contiguous axial images were obtained from the base of the skull through the vertex without intravenous contrast. COMPARISON:  Report from CT head dated 07/06/2018. FINDINGS: Brain: No evidence of acute infarction, hemorrhage, hydrocephalus, extra-axial collection or mass lesion/mass effect. Vascular: No hyperdense vessel or unexpected calcification. Skull: Normal. Negative for fracture or focal lesion. Sinuses/Orbits: No acute finding. Other: None. IMPRESSION: No acute intracranial process. Electronically Signed   By: Romona Curls  M.D.   On: 05/21/2020 09:27   MR BRAIN WO CONTRAST  Result Date: 05/23/2020 CLINICAL DATA:  TIA. Transient numbness involving the right side of the face and both arms. EXAM: MRI HEAD WITHOUT CONTRAST TECHNIQUE: Multiplanar, multiecho pulse sequences of the brain and surrounding structures were obtained without intravenous contrast. COMPARISON:  Head CT 05/21/2020 FINDINGS: Brain: There is no evidence of an acute infarct, intracranial hemorrhage, mass, midline shift, or extra-axial fluid collection. The ventricles and sulci are within normal limits for age. A few small foci of T2 hyperintensity in the cerebral white matter also within normal limits for age. Vascular: Major intracranial vascular flow voids are preserved. Skull and upper cervical spine: Unremarkable bone marrow signal. Sinuses/Orbits: Unremarkable orbits. Paranasal sinuses and mastoid air cells are clear. Other: None. IMPRESSION: Unremarkable appearance of the brain for age. Electronically Signed   By: Sebastian Ache M.D.   On: 05/23/2020 10:41   MR CERVICAL SPINE WO CONTRAST  Result Date: 05/23/2020 CLINICAL DATA:  Cervical spinal stenosis. Transient numbness involving the right side of the face and both arms. EXAM: MRI CERVICAL SPINE  WITHOUT CONTRAST TECHNIQUE: Multiplanar, multisequence MR imaging of the cervical spine was performed. No intravenous contrast was administered. COMPARISON:  None. FINDINGS: Alignment: Normal. Vertebrae: No fracture, suspicious osseous lesion, or significant marrow edema. Chronic degenerative endplate changes at C3-4, C5-6, and C6-7 associated with moderate disc space narrowing. Cord: Normal signal and morphology. Posterior Fossa, vertebral arteries, paraspinal tissues: Unremarkable. Disc levels: C2-3: Negative. C3-4: Broad-based posterior disc osteophyte complex without significant stenosis. C4-5: Negative. C5-6: Mild disc bulging and right uncovertebral spurring result in mild right neural foraminal stenosis  without spinal stenosis. C6-7: Mild disc bulging without stenosis. C7-T1: Negative. IMPRESSION: Multilevel cervical disc degeneration without spinal stenosis or compressive neural foraminal stenosis. Electronically Signed   By: Sebastian Ache M.D.   On: 05/23/2020 10:11   US Carotid Bilateral  Result Date: 05/22/2020 CLINICAL DATA:  TIA with right-sided facial numbness. EXAM: BILATERAL CAROTID DUPLEX ULTRASOUND TECHNIQUE: Wallace Cullens scale imaging, color Doppler and duplex ultrasound were performed of bilateral carotid and vertebral arteries in the neck. COMPARISON:  None. FINDINGS: Criteria: Quantification of carotid stenosis is based on velocity parameters that correlate the residual internal carotid diameter with NASCET-based stenosis levels, using the diameter of the distal internal carotid lumen as the denominator for stenosis measurement. The following velocity measurements were obtained: RIGHT ICA:  71/21 cm/sec CCA:  69/11 cm/sec SYSTOLIC ICA/CCA RATIO:  1.0 ECA:  70 cm/sec LEFT ICA:  68/21 cm/sec CCA:  63/13 cm/sec SYSTOLIC ICA/CCA RATIO:  1.1 ECA:  68 cm/sec RIGHT CAROTID ARTERY: The common carotid artery and carotid bulb demonstrate intimal thickening. There is no evidence of right ICA plaque or stenosis. RIGHT VERTEBRAL ARTERY: Antegrade flow with normal waveform and velocity. LEFT CAROTID ARTERY: The common carotid artery and carotid bulb demonstrate intimal thickening. There may be minimal plaque at the left ICA origin. No evidence of significant left ICA stenosis. Estimated left ICA stenosis is less than 50%. LEFT VERTEBRAL ARTERY: Antegrade flow with normal waveform and velocity. IMPRESSION: Potential minimal plaque at the left ICA origin with estimated left ICA stenosis of less than 50%. No evidence of right ICA plaque or stenosis. Electronically Signed   By: Irish Lack M.D.   On: 05/22/2020 12:18   ECHOCARDIOGRAM COMPLETE BUBBLE STUDY  Result Date: 05/21/2020    ECHOCARDIOGRAM REPORT   Patient  Name:   ARDITH LEWMAN Date of Exam: 05/21/2020 Medical Rec #:  [id number]    Height:       66.0 in Accession #:    [id number]   Weight:       155.0 lb Date of Birth:  1955/10/15     BSA:          1.794 m Patient Age:    64 years     BP:           145/79 mmHg Patient Gender: M            HR:           49 bpm. Exam Location:  Inpatient Procedure: 2D Echo Indications:    stroke 434.91  History:        Patient has no prior history of Echocardiogram examinations. TIA                 and HIV.  Sonographer:    Delcie Roch RDCS Referring Phys: [id number] SYLVESTER I OGBATA IMPRESSIONS  1. Left ventricular ejection fraction, by estimation, is 60 to 65%. The left ventricle has normal function. The left ventricle has no regional wall motion abnormalities.  Left ventricular diastolic parameters were normal.  2. Right ventricular systolic function is normal. The right ventricular size is normal. Tricuspid regurgitation signal is inadequate for assessing PA pressure.  3. The mitral valve is grossly normal. No evidence of mitral valve regurgitation. No evidence of mitral stenosis.  4. The aortic valve is tricuspid. Aortic valve regurgitation is not visualized. No aortic stenosis is present.  5. The inferior vena cava is normal in size with greater than 50% respiratory variability, suggesting right atrial pressure of 3 mmHg. Conclusion(s)/Recommendation(s): No intracardiac source of embolism detected on this transthoracic study. A transesophageal echocardiogram is recommended to exclude cardiac source of embolism if clinically indicated. FINDINGS  Left Ventricle: Left ventricular ejection fraction, by estimation, is 60 to 65%. The left ventricle has normal function. The left ventricle has no regional wall motion abnormalities. The left ventricular internal cavity size was normal in size. There is  no left ventricular hypertrophy. Left ventricular diastolic parameters were normal. Right Ventricle: The right ventricular size is normal. No  increase in right ventricular wall thickness. Right ventricular systolic function is normal. Tricuspid regurgitation signal is inadequate for assessing PA pressure. Left Atrium: Left atrial size was normal in size. Right Atrium: Right atrial size was normal in size. Pericardium: Trivial pericardial effusion is present. Mitral Valve: The mitral valve is grossly normal. No evidence of mitral valve regurgitation. No evidence of mitral valve stenosis. Tricuspid Valve: The tricuspid valve is grossly normal. Tricuspid valve regurgitation is not demonstrated. No evidence of tricuspid stenosis. Aortic Valve: The aortic valve is tricuspid. Aortic valve regurgitation is not visualized. No aortic stenosis is present. Pulmonic Valve: The pulmonic valve was grossly normal. Pulmonic valve regurgitation is not visualized. No evidence of pulmonic stenosis. Aorta: The aortic root and ascending aorta are structurally normal, with no evidence of dilitation. Venous: The inferior vena cava is normal in size with greater than 50% respiratory variability, suggesting right atrial pressure of 3 mmHg. IAS/Shunts: The atrial septum is grossly normal. Agitated saline contrast was given intravenously to evaluate for intracardiac shunting.  LEFT VENTRICLE PLAX 2D LVIDd:         4.19 cm  Diastology LVIDs:         3.08 cm  LV e' lateral:   13.10 cm/s LV PW:         1.07 cm  LV E/e' lateral: 4.9 LV IVS:        1.06 cm  LV e' medial:    10.10 cm/s LVOT diam:     2.30 cm  LV E/e' medial:  6.4 LV SV:         103 LV SV Index:   57 LVOT Area:     4.15 cm  RIGHT VENTRICLE RV S prime:     13.60 cm/s TAPSE (M-mode): 2.0 cm LEFT ATRIUM             Index LA diam:        3.10 cm 1.73 cm/m LA Vol (A2C):   37.1 ml 20.67 ml/m LA Vol (A4C):   38.3 ml 21.34 ml/m LA Biplane Vol: 38.5 ml 21.45 ml/m  AORTIC VALVE LVOT Vmax:   102.00 cm/s LVOT Vmean:  65.900 cm/s LVOT VTI:    0.248 m  AORTA Ao Root diam: 3.20 cm Ao Asc diam:  3.30 cm MITRAL VALVE MV Area (PHT):  2.48 cm    SHUNTS MV Decel Time: 306 msec    Systemic VTI:  0.25 m MV E velocity: 64.30 cm/s  Systemic  Diam: 2.30 cm MV A velocity: 58.70 cm/s MV E/A ratio:  1.10 Lennie Odor MD Electronically signed by Lennie Odor MD Signature Date/Time: 05/21/2020/5:17:32 PM    Final       The results of significant diagnostics from this hospitalization (including imaging, microbiology, ancillary and laboratory) are listed below for reference.     Microbiology: Recent Results (from the past 240 hour(s))  SARS Coronavirus 2 by RT PCR (hospital order, performed in [facility] hospital lab) Nasopharyngeal Nasopharyngeal Swab     Status: None   Collection Time: 05/21/20  4:44 PM   Specimen: Nasopharyngeal Swab  Result Value Ref Range Status   SARS Coronavirus 2 NEGATIVE NEGATIVE Final    Comment: (NOTE) SARS-CoV-2 target nucleic acids are NOT DETECTED.  The SARS-CoV-2 RNA is generally detectable in upper and lower respiratory specimens during the acute phase of infection. The lowest concentration of SARS-CoV-2 viral copies this assay can detect is 250 copies / mL. A negative result does not preclude SARS-CoV-2 infection and should not be used as the sole basis for treatment or other patient management decisions.  A negative result may occur with improper specimen collection / handling, submission of specimen other than nasopharyngeal swab, presence of viral mutation(s) within the areas targeted by this assay, and inadequate number of viral copies (<250 copies / mL). A negative result must be combined with clinical observations, patient history, and epidemiological information.  Fact Sheet for Patients:   BoilerBrush.com.cy  Fact Sheet for Healthcare Providers: https://pope.com/  This test is not yet approved or  cleared by the [place] FDA and has been authorized for detection and/or diagnosis of SARS-CoV-2 by FDA under an Emergency Use  Authorization (EUA).  This EUA will remain in effect (meaning this test can be used) for the duration of the COVID-19 declaration under Section 564(b)(1) of the Act, 21 U.S.C. section 360bbb-3(b)(1), unless the authorization is terminated or revoked sooner.  Performed at [facility], [address], [place], [place] [address]      Labs: BNP (last 3 results) No results for input(s): BNP in the last 8760 hours. Basic Metabolic Panel: Recent Labs  Lab 05/21/20 0813 05/22/20 0612  NA 140 137  K 3.9 3.6  CL 105 104  CO2 27 26  GLUCOSE 97 91  BUN 11 12  CREATININE 1.00 0.92  CALCIUM 9.2 8.9  MG  --  2.2  PHOS  --  3.6   Liver Function Tests: Recent Labs  Lab 05/21/20 0842  AST 24  ALT 17  ALKPHOS 45  BILITOT 0.8  PROT 7.2  ALBUMIN 4.0   No results for input(s): LIPASE, AMYLASE in the last 168 hours. No results for input(s): AMMONIA in the last 168 hours. CBC: Recent Labs  Lab 05/21/20 0813 05/22/20 0612  WBC 3.8* 4.5  HGB 13.0 12.7*  HCT 39.4 37.5*  MCV 91.6 90.1  PLT 188 193   Cardiac Enzymes: No results for input(s): CKTOTAL, CKMB, CKMBINDEX, TROPONINI in the last 168 hours. BNP: Invalid input(s): POCBNP CBG: No results for input(s): GLUCAP in the last 168 hours. D-Dimer No results for input(s): DDIMER in the last 72 hours. Hgb A1c Recent Labs    05/21/20 1332  HGBA1C 6.1*   Lipid Profile Recent Labs    05/21/20 1332  CHOL 99  HDL 38*  LDLCALC 52  TRIG 43  CHOLHDL 2.6   Thyroid function studies Recent Labs    05/21/20 1332  TSH 0.824   Anemia work up Recent  Labs    05/21/20 1332  VITAMINB12 582  FOLATE 14.4   Urinalysis    Component Value Date/Time   COLORURINE STRAW (A) 05/21/2020 1322   APPEARANCEUR CLEAR 05/21/2020 1322   LABSPEC 1.003 (L) 05/21/2020 1322   PHURINE 7.0 05/21/2020 1322   GLUCOSEU NEGATIVE 05/21/2020 1322   HGBUR NEGATIVE 05/21/2020 1322   BILIRUBINUR NEGATIVE 05/21/2020 1322   KETONESUR NEGATIVE  05/21/2020 1322   PROTEINUR NEGATIVE 05/21/2020 1322   NITRITE NEGATIVE 05/21/2020 1322   LEUKOCYTESUR NEGATIVE 05/21/2020 1322   Sepsis Labs Invalid input(s): PROCALCITONIN,  WBC,  LACTICIDVEN Microbiology Recent Results (from the past 240 hour(s))  SARS Coronavirus 2 by RT PCR (hospital order, performed in [facility] hospital lab) Nasopharyngeal Nasopharyngeal Swab     Status: None   Collection Time: 05/21/20  4:44 PM   Specimen: Nasopharyngeal Swab  Result Value Ref Range Status   SARS Coronavirus 2 NEGATIVE NEGATIVE Final    Comment: (NOTE) SARS-CoV-2 target nucleic acids are NOT DETECTED.  The SARS-CoV-2 RNA is generally detectable in upper and lower respiratory specimens during the acute phase of infection. The lowest concentration of SARS-CoV-2 viral copies this assay can detect is 250 copies / mL. A negative result does not preclude SARS-CoV-2 infection and should not be used as the sole basis for treatment or other patient management decisions.  A negative result may occur with improper specimen collection / handling, submission of specimen other than nasopharyngeal swab, presence of viral mutation(s) within the areas targeted by this assay, and inadequate number of viral copies (<250 copies / mL). A negative result must be combined with clinical observations, patient history, and epidemiological information.  Fact Sheet for Patients:   BoilerBrush.com.cy  Fact Sheet for Healthcare Providers: https://pope.com/  This test is not yet approved or  cleared by the [place] FDA and has been authorized for detection and/or diagnosis of SARS-CoV-2 by FDA under an Emergency Use Authorization (EUA).  This EUA will remain in effect (meaning this test can be used) for the duration of the COVID-19 declaration under Section 564(b)(1) of the Act, 21 U.S.C. section 360bbb-3(b)(1), unless the authorization is terminated or revoked  sooner.  Performed at [facility], [address] [place], [place] [address]      Time coordinating discharge:  I have spent 35 minutes face to face with the patient and on the ward discussing the patients care, assessment, plan and disposition with other care givers. >50% of the time was devoted counseling the patient about the risks and benefits of treatment/Discharge disposition and coordinating care.   SIGNED:   Dimple Nanas, MD  [facility] 05/23/2020, 10:53 AM   If 7PM-7AM, please contact night-coverage

## 2020-10-12 ENCOUNTER — Other Ambulatory Visit (HOSPITAL_COMMUNITY): Payer: Self-pay | Admitting: Nurse Practitioner

## 2020-10-12 ENCOUNTER — Other Ambulatory Visit: Payer: Self-pay | Admitting: Nurse Practitioner

## 2020-10-12 DIAGNOSIS — R1310 Dysphagia, unspecified: Secondary | ICD-10-CM

## 2020-10-28 ENCOUNTER — Ambulatory Visit (HOSPITAL_COMMUNITY): Payer: No Typology Code available for payment source

## 2020-10-28 ENCOUNTER — Encounter (HOSPITAL_COMMUNITY): Payer: Self-pay

## 2020-11-01 ENCOUNTER — Other Ambulatory Visit: Payer: Self-pay

## 2020-11-01 ENCOUNTER — Ambulatory Visit (HOSPITAL_COMMUNITY)
Admission: RE | Admit: 2020-11-01 | Discharge: 2020-11-01 | Disposition: A | Discharge status: Home / Self Care | Payer: No Typology Code available for payment source | Source: Ambulatory Visit | Attending: Nurse Practitioner | Admitting: Nurse Practitioner

## 2020-11-01 DIAGNOSIS — R1310 Dysphagia, unspecified: Secondary | ICD-10-CM | POA: Diagnosis not present

## 2021-04-15 ENCOUNTER — Emergency Department (HOSPITAL_COMMUNITY)
Admission: EM | Admit: 2021-04-15 | Discharge: 2021-04-15 | Disposition: A | Discharge status: Home / Self Care | Payer: No Typology Code available for payment source | Attending: Emergency Medicine | Admitting: Emergency Medicine

## 2021-04-15 ENCOUNTER — Encounter (HOSPITAL_COMMUNITY): Payer: Self-pay

## 2021-04-15 ENCOUNTER — Emergency Department (HOSPITAL_COMMUNITY): Payer: No Typology Code available for payment source

## 2021-04-15 ENCOUNTER — Other Ambulatory Visit: Payer: Self-pay

## 2021-04-15 DIAGNOSIS — R519 Headache, unspecified: Secondary | ICD-10-CM | POA: Diagnosis not present

## 2021-04-15 DIAGNOSIS — Z21 Asymptomatic human immunodeficiency virus [HIV] infection status: Secondary | ICD-10-CM | POA: Diagnosis not present

## 2021-04-15 DIAGNOSIS — M542 Cervicalgia: Secondary | ICD-10-CM | POA: Diagnosis not present

## 2021-04-15 DIAGNOSIS — R42 Dizziness and giddiness: Secondary | ICD-10-CM | POA: Diagnosis not present

## 2021-04-15 DIAGNOSIS — Z7982 Long term (current) use of aspirin: Secondary | ICD-10-CM | POA: Diagnosis not present

## 2021-04-15 LAB — CBC WITH DIFFERENTIAL/PLATELET
Abs Immature Granulocytes: 0.01 10*3/uL (ref 0.00–0.07)
Basophils Absolute: 0 10*3/uL (ref 0.0–0.1)
Basophils Relative: 1 %
Eosinophils Absolute: 0.2 10*3/uL (ref 0.0–0.5)
Eosinophils Relative: 4 %
HCT: 37.1 % — ABNORMAL LOW (ref 39.0–52.0)
Hemoglobin: 12.4 g/dL — ABNORMAL LOW (ref 13.0–17.0)
Immature Granulocytes: 0 %
Lymphocytes Relative: 50 %
Lymphs Abs: 2.2 10*3/uL (ref 0.7–4.0)
MCH: 30.6 pg (ref 26.0–34.0)
MCHC: 33.4 g/dL (ref 30.0–36.0)
MCV: 91.6 fL (ref 80.0–100.0)
Monocytes Absolute: 0.4 10*3/uL (ref 0.1–1.0)
Monocytes Relative: 8 %
Neutro Abs: 1.6 10*3/uL — ABNORMAL LOW (ref 1.7–7.7)
Neutrophils Relative %: 37 %
Platelets: 170 10*3/uL (ref 150–400)
RBC: 4.05 MIL/uL — ABNORMAL LOW (ref 4.22–5.81)
RDW: 13.2 % (ref 11.5–15.5)
WBC: 4.4 10*3/uL (ref 4.0–10.5)
nRBC: 0 % (ref 0.0–0.2)

## 2021-04-15 LAB — URINALYSIS, ROUTINE W REFLEX MICROSCOPIC
Bacteria, UA: NONE SEEN
Bilirubin Urine: NEGATIVE
Glucose, UA: NEGATIVE mg/dL
Hgb urine dipstick: NEGATIVE
Ketones, ur: NEGATIVE mg/dL
Nitrite: NEGATIVE
Protein, ur: NEGATIVE mg/dL
Specific Gravity, Urine: 1.008 (ref 1.005–1.030)
pH: 7 (ref 5.0–8.0)

## 2021-04-15 LAB — COMPREHENSIVE METABOLIC PANEL
ALT: 19 U/L (ref 0–44)
AST: 24 U/L (ref 15–41)
Albumin: 4 g/dL (ref 3.5–5.0)
Alkaline Phosphatase: 48 U/L (ref 38–126)
Anion gap: 5 (ref 5–15)
BUN: 12 mg/dL (ref 8–23)
CO2: 28 mmol/L (ref 22–32)
Calcium: 9 mg/dL (ref 8.9–10.3)
Chloride: 105 mmol/L (ref 98–111)
Creatinine, Ser: 0.96 mg/dL (ref 0.61–1.24)
GFR, Estimated: >60 mL/min (ref >60)
Glucose, Bld: 93 mg/dL (ref 70–99)
Potassium: 3.6 mmol/L (ref 3.5–5.1)
Sodium: 138 mmol/L (ref 135–145)
Total Bilirubin: 0.7 mg/dL (ref 0.3–1.2)
Total Protein: 7.1 g/dL (ref 6.5–8.1)

## 2021-04-15 MED ORDER — IOHEXOL 350 MG/ML SOLN
100.0000 mL | Freq: Once | INTRAVENOUS | Status: AC | PRN
  Administered 2021-04-15: 100 mL via INTRAVENOUS

## 2021-04-15 MED ORDER — LACTATED RINGERS IV BOLUS
1000.0000 mL | Freq: Once | INTRAVENOUS | Status: AC
  Administered 2021-04-15: 1000 mL via INTRAVENOUS

## 2021-04-15 MED ORDER — MECLIZINE HCL 12.5 MG PO TABS
25.0000 mg | ORAL_TABLET | Freq: Once | ORAL | Status: AC
  Administered 2021-04-15: 25 mg via ORAL
  Filled 2021-04-15: qty 2

## 2021-04-15 NOTE — Discharge Instructions (Addendum)
Please follow-up with your primary care doctor.  Come back to ER if you develop worsening dizziness, episodes of passing out, chest pain, difficulty in breathing or other new concerning symptom.  Recommend resting this weekend.

## 2021-04-15 NOTE — ED Provider Notes (Signed)
[facility] EMERGENCY DEPARTMENT Provider Note   CSN: [id number] Arrival date & time: 04/15/21  4098     History Chief Complaint  Patient presents with   Dizziness    Jesus Ford is a 65 y.o. male.  Patient here with a couple different complaints.  Is unclear what actually brought him to emergency room.  He told nursing that he was having shortness of breath after being diagnosed with pneumonia last week.  He denies any shortness of breath to me.  He states he feels dizzy.  States that he will have some neck pain and some headache once in a while and then will have a feeling of being drunk when he is walking and has some balance issues.  He states that this has been going on intermittently for a while but in the last couple days has been more consistently.  States is better when he drinks water.  States is worse with certain positions.   Shortness of Breath     Past Medical History:  Diagnosis Date   Anxiety 05/22/1999   Arthritis    HIV (human immunodeficiency virus infection) [facility])     Patient Active Problem List   Diagnosis Date Noted   TIA (transient ischemic attack) 05/21/2020    Past Surgical History:  Procedure Laterality Date   hemmoroid surgery  N/A 1995       No family history on file.  Social History   Tobacco Use   Smoking status: Never   Smokeless tobacco: Never  Substance Use Topics   Alcohol use: Not Currently   Drug use: Not Currently    Home Medications Prior to Admission medications   Medication Sig Start Date End Date Taking? Authorizing Provider  aspirin EC 81 MG tablet Take 81 mg by mouth daily. Swallow whole.    [provider]  atorvastatin (LIPITOR) 20 MG tablet Take 20 mg by mouth at bedtime.    [provider]  bictegravir-emtricitabine-tenofovir AF (BIKTARVY) 50-200-25 MG TABS tablet Take 1 tablet by mouth daily.    [provider]  Cholecalciferol (VITAMIN D3 PO) Take 1 tablet by mouth daily.     [provider]  hydrOXYzine (ATARAX/VISTARIL) 25 MG tablet Take 25 mg by mouth 2 (two) times daily as needed.    [provider]  lisinopril (ZESTRIL) 5 MG tablet Take 5 mg by mouth daily.    [provider]    Allergies    Patient has no known allergies.  Review of Systems   Review of Systems  Respiratory:  Positive for shortness of breath.   All other systems reviewed and are negative.  Physical Exam Updated Vital Signs BP 122/74   Pulse (!) 58   Temp 98.1 F (36.7 C) (Oral)   Resp 15   Ht 5\' 6"  (1.676 m)   Wt 68.9 kg   SpO2 99%   BMI 24.53 kg/m   Physical Exam Vitals and nursing note reviewed.  Constitutional:      Appearance: He is well-developed.  HENT:     Head: Normocephalic and atraumatic.     Mouth/Throat:     Mouth: Mucous membranes are moist.     Pharynx: Oropharynx is clear.  Eyes:     Pupils: Pupils are equal, round, and reactive to light.  Cardiovascular:     Rate and Rhythm: Normal rate.  Pulmonary:     Effort: Pulmonary effort is normal. No respiratory distress.  Abdominal:     General: There is no  distension.  Musculoskeletal:        General: Normal range of motion.     Cervical back: Normal range of motion.  Neurological:     Mental Status: He is alert.     Comments: No altered mental status, able to give full seemingly accurate history.  Face is symmetric, EOM's intact, pupils equal and reactive, vision intact, tongue and uvula midline without deviation. Upper and Lower extremity motor 5/5, intact pain perception in distal extremities, 2+ reflexes in biceps, patella and achilles tendons.     ED Results / Procedures / Treatments   Labs (all labs ordered are listed, but only abnormal results are displayed) Labs Reviewed  CBC WITH DIFFERENTIAL/PLATELET - Abnormal; Notable for the following components:      Result Value   RBC 4.05 (*)    Hemoglobin 12.4 (*)    HCT 37.1 (*)    Neutro Abs 1.6 (*)    All other  components within normal limits  URINALYSIS, ROUTINE W REFLEX MICROSCOPIC - Abnormal; Notable for the following components:   Color, Urine STRAW (*)    Leukocytes,Ua TRACE (*)    All other components within normal limits  COMPREHENSIVE METABOLIC PANEL    EKG None  Radiology No results found.  Procedures Procedures   Medications Ordered in ED Medications  iohexol (OMNIPAQUE) 350 MG/ML injection 100 mL (has no administration in time range)  meclizine (ANTIVERT) tablet 25 mg (25 mg Oral Given 04/15/21 0436)  lactated ringers bolus 1,000 mL (1,000 mLs Intravenous New Bag/Given 04/15/21 0435)    ED Course  I have reviewed the triage vital signs and the nursing notes.  Pertinent labs & imaging results that were available during my care of the patient were reviewed by me and considered in my medical decision making (see chart for details).    MDM Rules/Calculators/A&P                         Possibly just anxiety however with the neck pain, headache and vertigo will need to rule out vertebral pathology.  We will get a CTA.  If this is negative patient can be discharged. Care transferred pending cta's and reeval with likely discharge if normal.   Final Clinical Impression(s) / ED Diagnoses Final diagnoses:  None    Rx / DC Orders ED Discharge Orders     None        Iwalani Templeton, Barbara Cower, MD 04/15/21 0700

## 2021-04-15 NOTE — ED Notes (Signed)
EKG done @ 0352. EKG seen by Dr Clayborne Dana

## 2021-04-15 NOTE — ED Triage Notes (Signed)
Pt from home, presents to Ed with c/o sob that started about a week ago. Pt had covid 3 weeks ago. Pt says he went to [facility] ED last weekend, but unsure of results. Pt was ambulatory from [facility] to tx room one with no labored breathing. Pt has albuterol inhaler that was prescribed to him last week at [facility] ED but has not used it until tonight- pt 4 puffs before arrival and says it helped.

## 2021-04-15 NOTE — ED Provider Notes (Signed)
65 year old male presents to ER with concern for dizziness after recent treatment of pneumonia.  Had some associated neck pain.  CTA head and neck ordered to further evaluate.  7:00 AM Received signout from [facility], follow-up on CT, discharge if normal and patient remains well-appearing  8:10 AM reassessed patient, discussed results, patient remains well-appearing, vitals are all stable, discharged home, recommended follow-up with primary   Milagros Loll, MD 04/15/21 [phone]

## 2021-09-18 IMAGING — US US CAROTID DUPLEX BILAT
1 series · 13 of 24 positions shown · non-contrast
Comparison: None.

CLINICAL DATA: TIA with right-sided facial numbness.

EXAM:
BILATERAL CAROTID DUPLEX ULTRASOUND
TECHNIQUE: Gray scale imaging, color Doppler and duplex ultrasound were
performed of bilateral carotid and vertebral arteries in the neck.

[Series 1: us carotid bilateral · 13 of 69 slices shown]
[im 1/69]
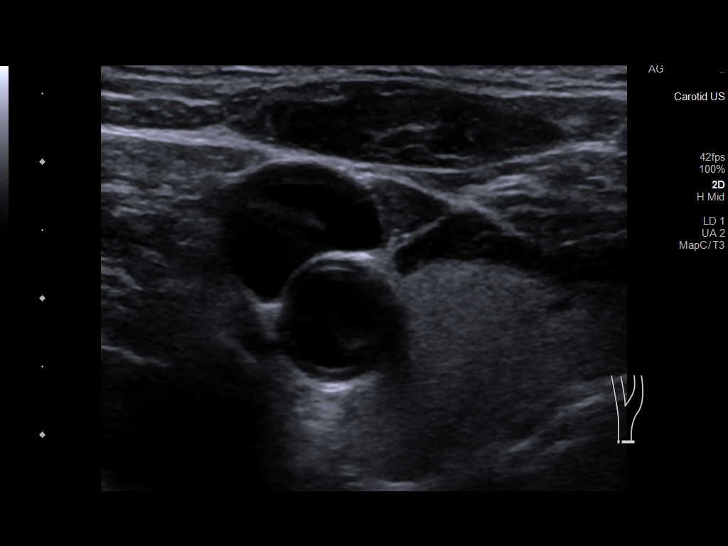
[im 6/69]
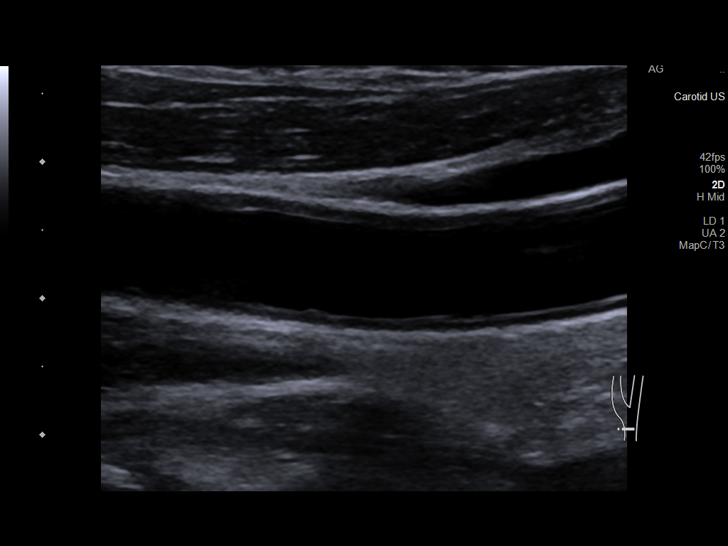
[im 12/69]
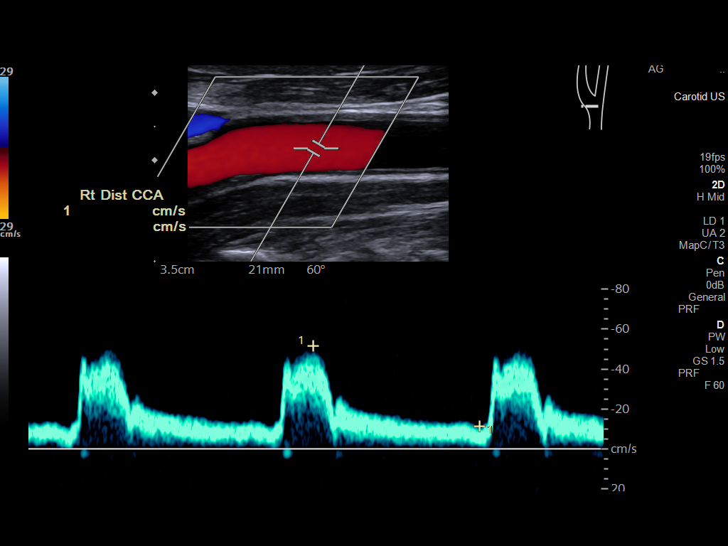
[im 18/69]
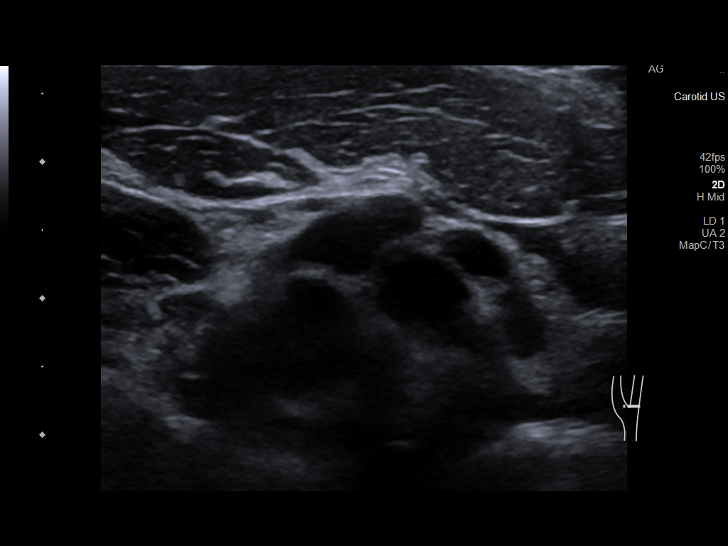
[im 24/69]
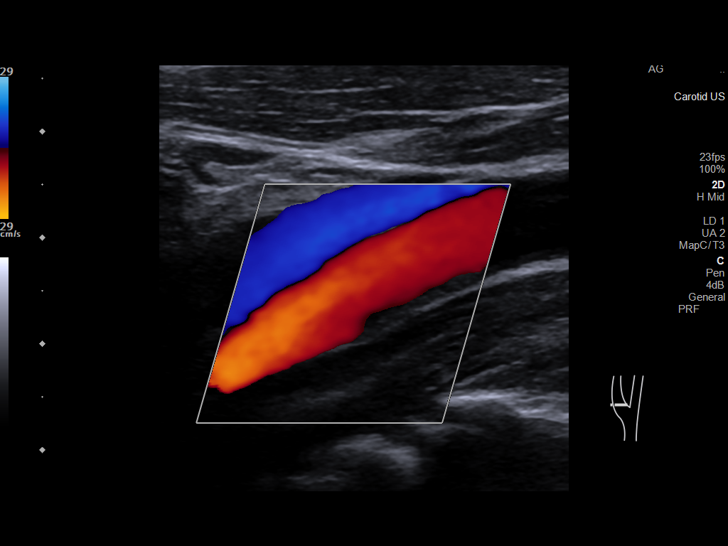
[im 30/69]
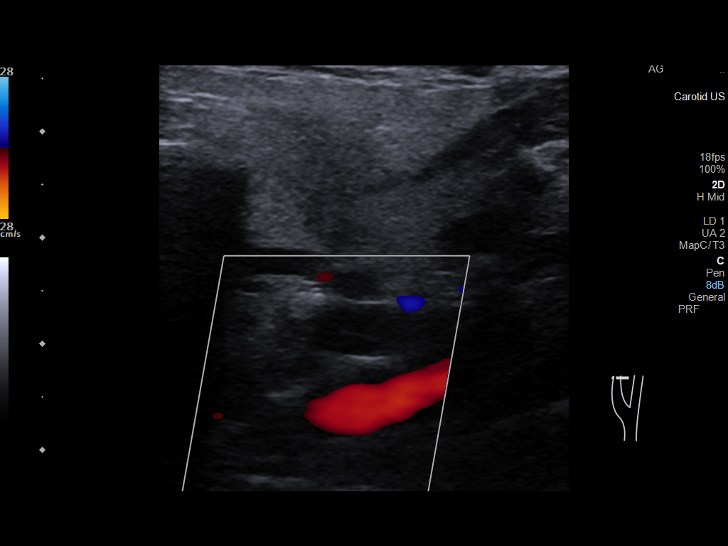
[im 36/69]
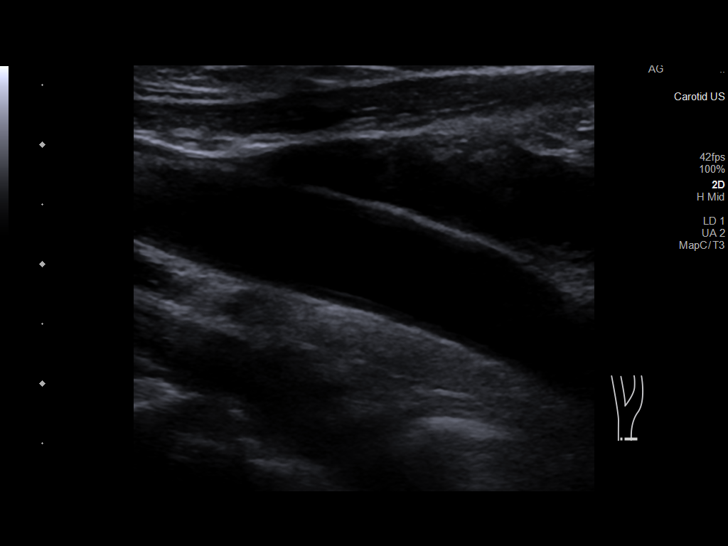
[im 39/69]
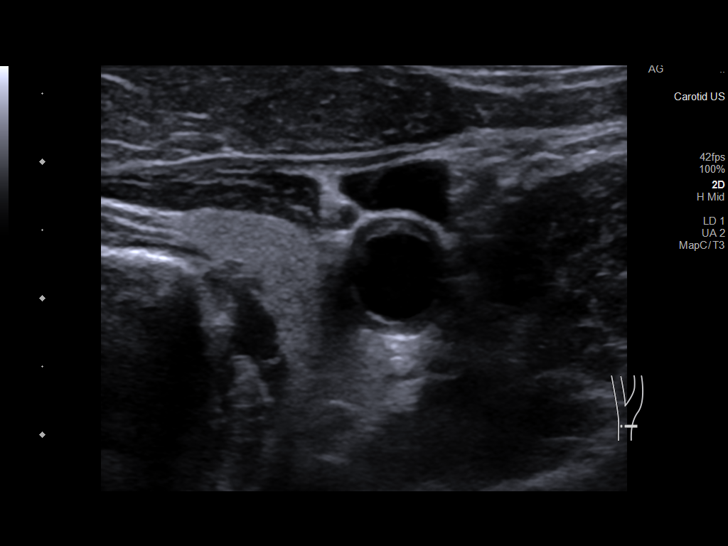
[im 45/69]
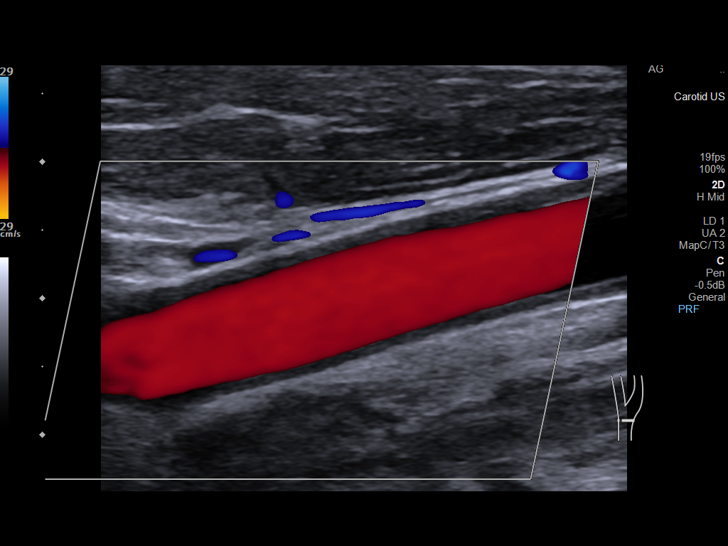
[im 51/69]
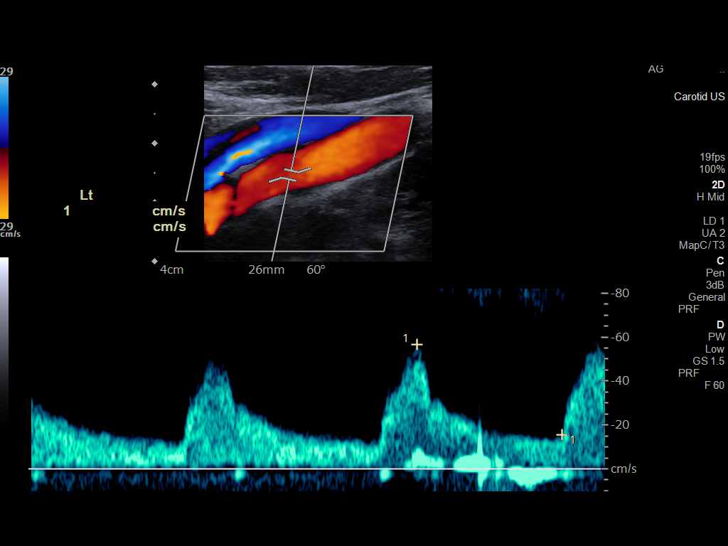
[im 57/69]
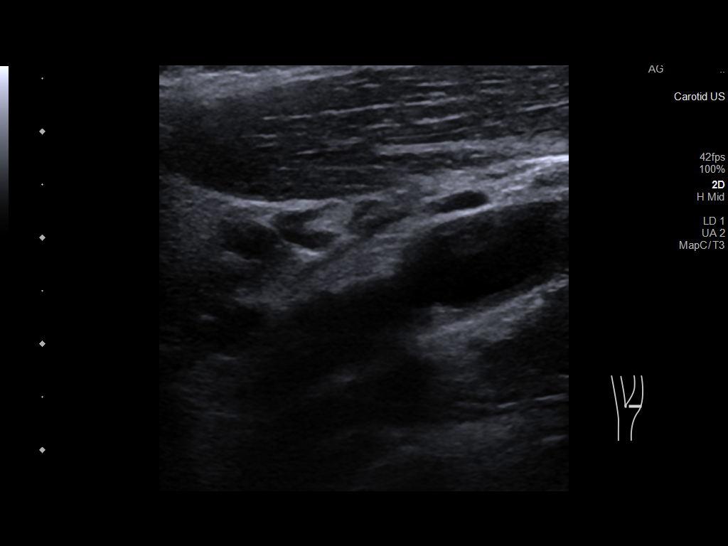
[im 63/69]
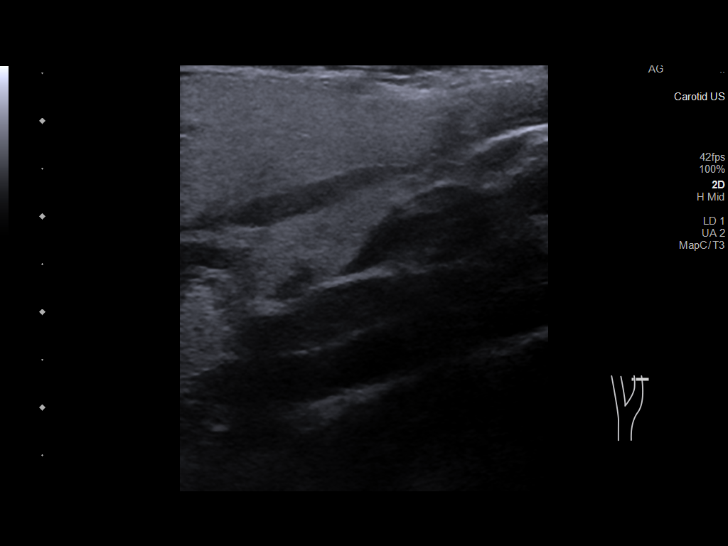
[im 69/69]
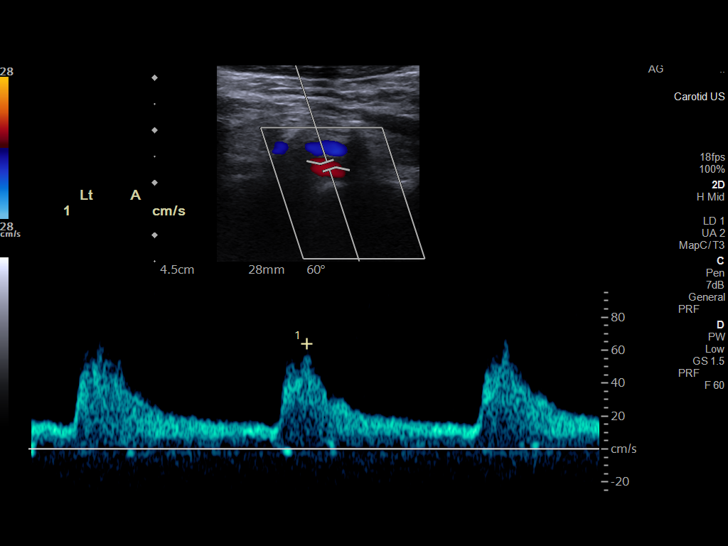

[13 of 24 positions shown; findings below may reference images not displayed]

FINDINGS: Criteria: Quantification of carotid stenosis is based on velocity
parameters that correlate the residual internal carotid diameter
with NASCET-based stenosis levels, using the diameter of the distal
internal carotid lumen as the denominator for stenosis measurement.

The following velocity measurements were obtained:

RIGHT

ICA:  71/21 cm/sec

CCA:  69/11 cm/sec

SYSTOLIC ICA/CCA RATIO:

ECA:  70 cm/sec

LEFT

ICA:  68/21 cm/sec

CCA:  63/13 cm/sec

SYSTOLIC ICA/CCA RATIO:

ECA:  68 cm/sec

RIGHT CAROTID ARTERY: The common carotid artery and carotid bulb
demonstrate intimal thickening. There is no evidence of right ICA
plaque or stenosis.

RIGHT VERTEBRAL ARTERY: Antegrade flow with normal waveform and
velocity.

LEFT CAROTID ARTERY: The common carotid artery and carotid bulb
demonstrate intimal thickening. There may be minimal plaque at the
left ICA origin. No evidence of significant left ICA stenosis.
Estimated left ICA stenosis is less than 50%.

LEFT VERTEBRAL ARTERY: Antegrade flow with normal waveform and
velocity.
IMPRESSION: Potential minimal plaque at the left ICA origin with estimated left
ICA stenosis of less than 50%. No evidence of right ICA plaque or
stenosis.

## 2021-09-19 IMAGING — MR MR CERVICAL SPINE W/O CM
5 series · 34 of 48 positions shown · non-contrast
Comparison: None.

CLINICAL DATA: Cervical spinal stenosis. Transient numbness
involving the right side of the face and both arms.

EXAM:
MRI CERVICAL SPINE WITHOUT CONTRAST
TECHNIQUE: Multiplanar, multisequence MR imaging of the cervical spine was
performed. No intravenous contrast was administered.

[Series 1: t2_tse_sag_fast · sagittal · 3.0mm · 0.43mm/px · 6 of 13 slices shown]
[im 1/13]
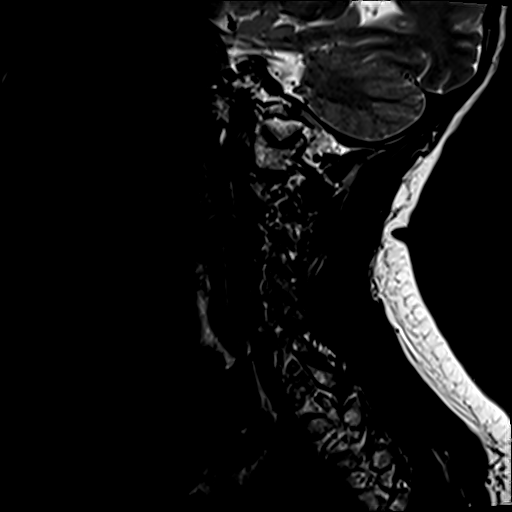
[im 3/13]
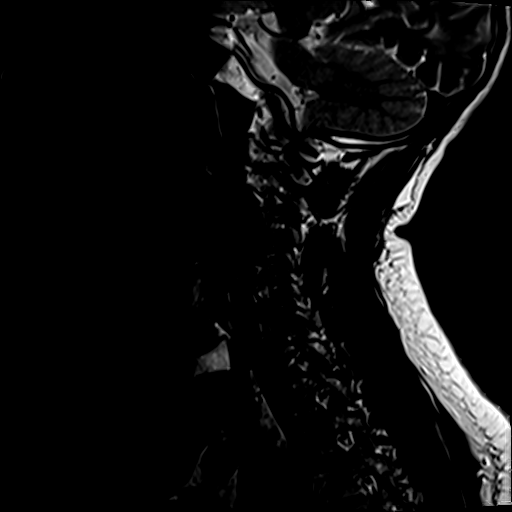
[im 5/13]
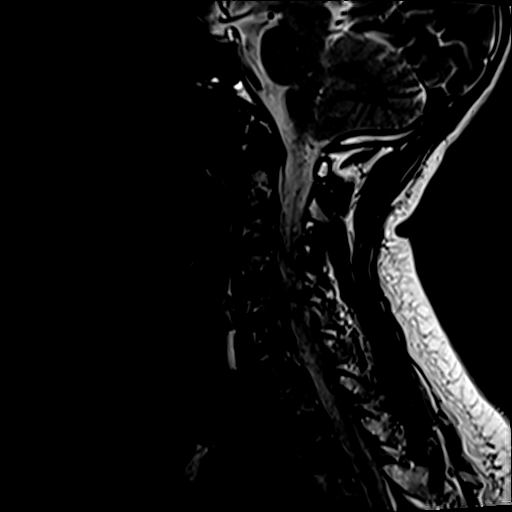
[im 8/13]
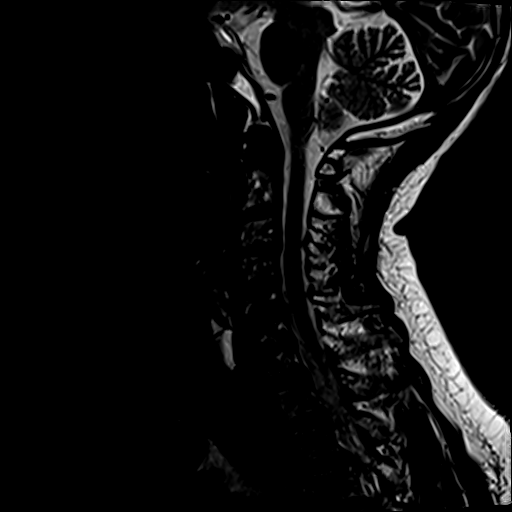
[im 10/13]
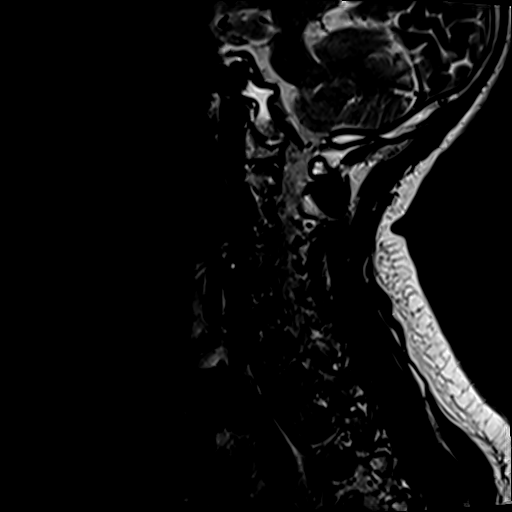
[im 13/13]
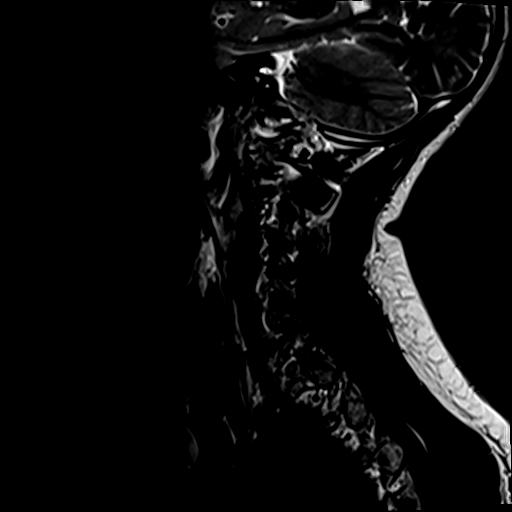

[Series 2: t1_tse_sag_fast · sagittal · 3.0mm · 0.43mm/px · 7 of 13 slices shown]
[im 1/13]
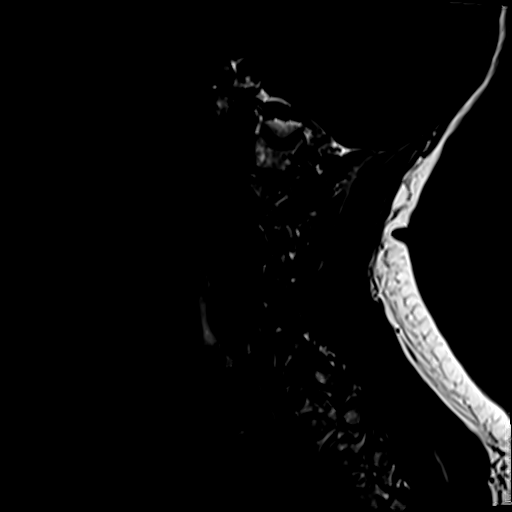
[im 3/13]
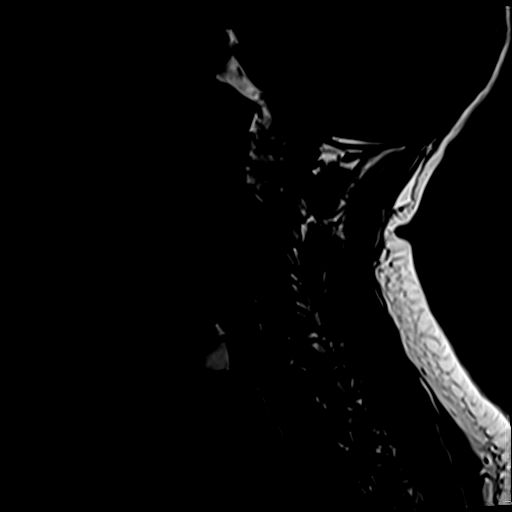
[im 5/13]
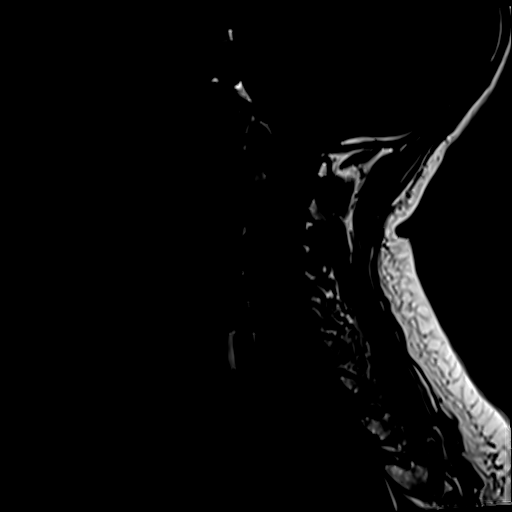
[im 7/13]
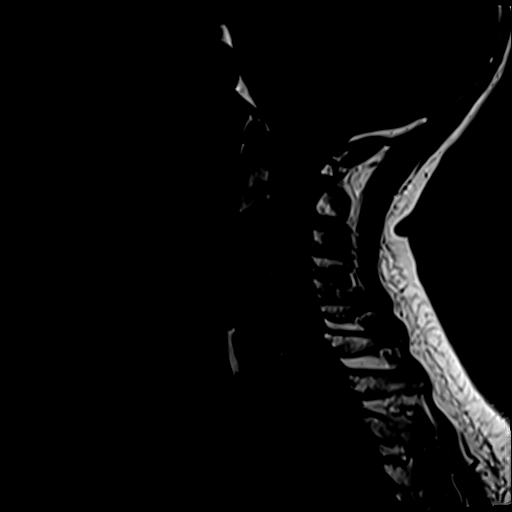
[im 9/13]
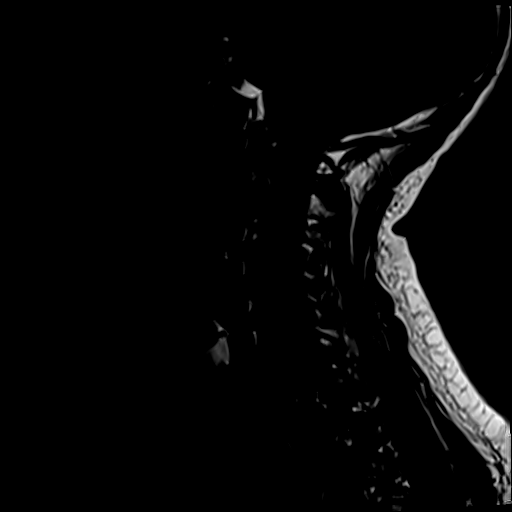
[im 11/13]
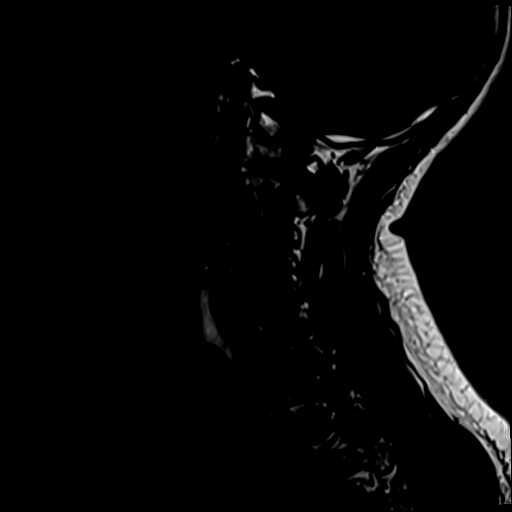
[im 13/13]
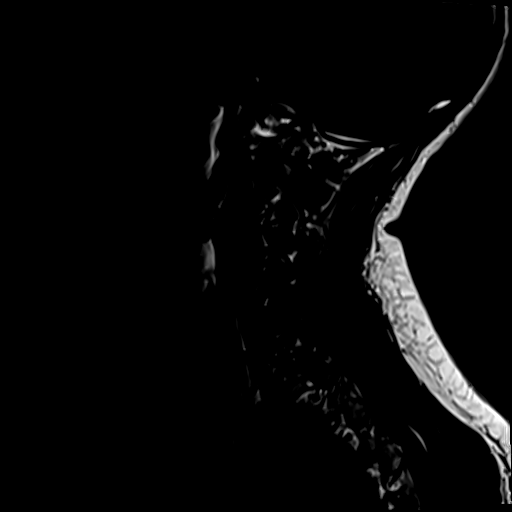

[Series 3: STIR · sagittal · 3.0mm · 0.86mm/px · 7 of 13 slices shown]
[im 1/13]
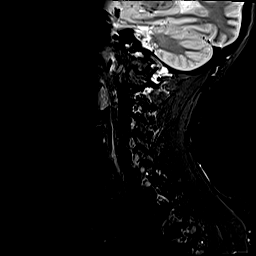
[im 3/13]
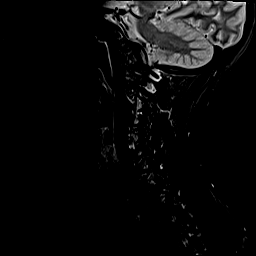
[im 5/13]
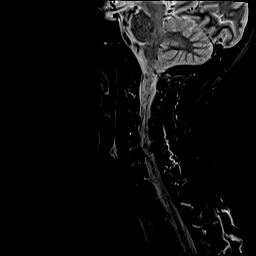
[im 7/13]
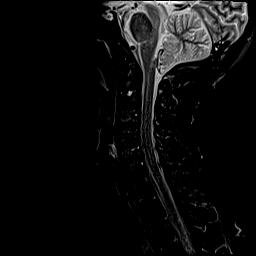
[im 9/13]
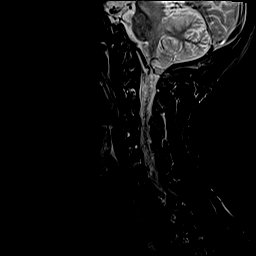
[im 11/13]
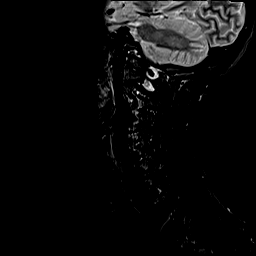
[im 13/13]
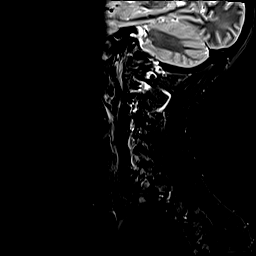

[Series 4: t2_tse_tra_fast · axial · 3.0mm · 0.78mm/px · z∈[-233,-146]mm · 8 of 28 slices shown]
[im 1/28]
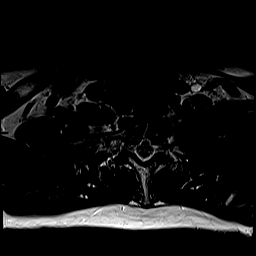
[im 5/28]
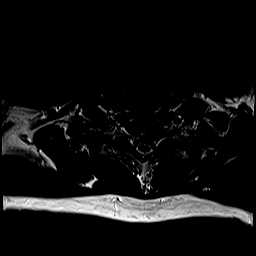
[im 9/28]
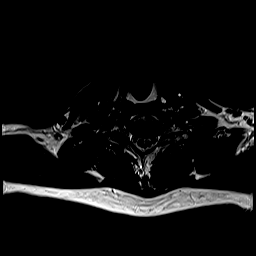
[im 13/28]
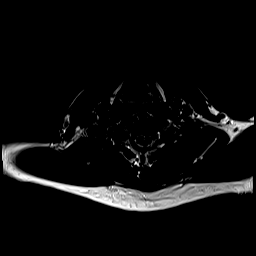
[im 15/28]
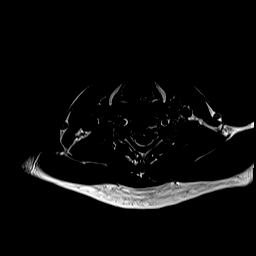
[im 19/28]
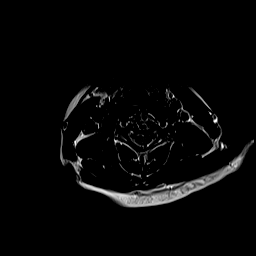
[im 23/28]
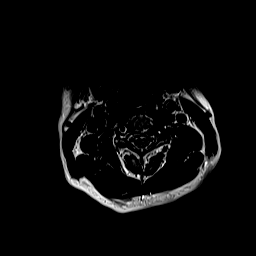
[im 28/28]
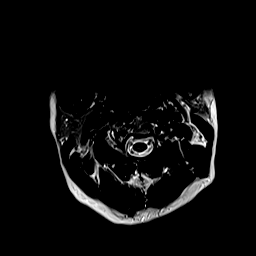

[Series 5: GRE · axial · 3.0mm · 0.78mm/px · z∈[-233,-175]mm · 6 of 28 slices shown]
[im 1/28]
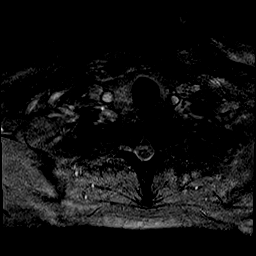
[im 5/28]
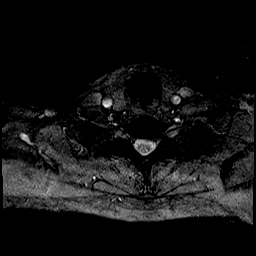
[im 9/28]
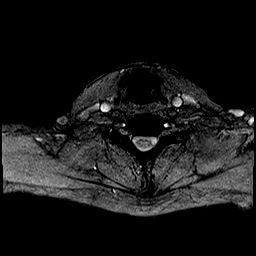
[im 13/28]
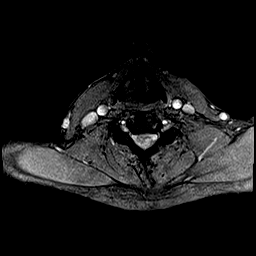
[im 15/28]
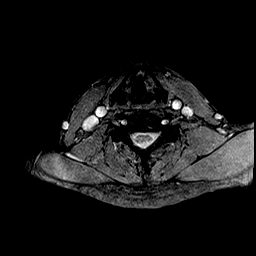
[im 19/28]
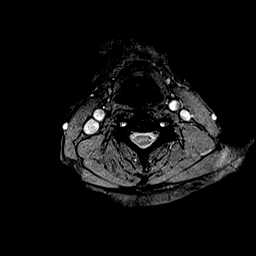

[34 of 48 positions shown; findings below may reference images not displayed]

FINDINGS: Alignment: Normal.

Vertebrae: No fracture, suspicious osseous lesion, or significant
marrow edema. Chronic degenerative endplate changes at C3-4, C5-6,
and C6-7 associated with moderate disc space narrowing.

Cord: Normal signal and morphology.

Posterior Fossa, vertebral arteries, paraspinal tissues:
Unremarkable.

Disc levels:

C2-3: Negative.

C3-4: Broad-based posterior disc osteophyte complex without
significant stenosis.

C4-5: Negative.

C5-6: Mild disc bulging and right uncovertebral spurring result in
mild right neural foraminal stenosis without spinal stenosis.

C6-7: Mild disc bulging without stenosis.

C7-T1: Negative.
IMPRESSION: Multilevel cervical disc degeneration without spinal stenosis or
compressive neural foraminal stenosis.
